# Patient Record
Sex: Female | Born: 1965 | Race: White | Hispanic: No | Marital: Married | Smoking: Never smoker
Health system: Southern US, Community
[De-identification: ages and names within clinical notes are randomized; demographics above are authoritative.]

## PROBLEM LIST (undated history)

## (undated) DIAGNOSIS — G5600 Carpal tunnel syndrome, unspecified upper limb: Secondary | ICD-10-CM

## (undated) DIAGNOSIS — G4733 Obstructive sleep apnea (adult) (pediatric): Secondary | ICD-10-CM

## (undated) DIAGNOSIS — G43909 Migraine, unspecified, not intractable, without status migrainosus: Secondary | ICD-10-CM

## (undated) DIAGNOSIS — M1990 Unspecified osteoarthritis, unspecified site: Secondary | ICD-10-CM

## (undated) DIAGNOSIS — R519 Headache, unspecified: Secondary | ICD-10-CM

## (undated) DIAGNOSIS — I10 Essential (primary) hypertension: Secondary | ICD-10-CM

## (undated) DIAGNOSIS — C801 Malignant (primary) neoplasm, unspecified: Secondary | ICD-10-CM

## (undated) DIAGNOSIS — C4490 Unspecified malignant neoplasm of skin, unspecified: Secondary | ICD-10-CM

## (undated) DIAGNOSIS — G4700 Insomnia, unspecified: Secondary | ICD-10-CM

## (undated) DIAGNOSIS — R112 Nausea with vomiting, unspecified: Secondary | ICD-10-CM

## (undated) DIAGNOSIS — F419 Anxiety disorder, unspecified: Secondary | ICD-10-CM

## (undated) DIAGNOSIS — D649 Anemia, unspecified: Secondary | ICD-10-CM

## (undated) DIAGNOSIS — E785 Hyperlipidemia, unspecified: Secondary | ICD-10-CM

## (undated) DIAGNOSIS — C4491 Basal cell carcinoma of skin, unspecified: Secondary | ICD-10-CM

## (undated) DIAGNOSIS — H5462 Unqualified visual loss, left eye, normal vision right eye: Secondary | ICD-10-CM

## (undated) DIAGNOSIS — Z9889 Other specified postprocedural states: Secondary | ICD-10-CM

## (undated) DIAGNOSIS — R51 Headache: Secondary | ICD-10-CM

## (undated) DIAGNOSIS — G4730 Sleep apnea, unspecified: Secondary | ICD-10-CM

## (undated) HISTORY — DX: Obstructive sleep apnea (adult) (pediatric): G47.33

## (undated) HISTORY — DX: Migraine, unspecified, not intractable, without status migrainosus: G43.909

## (undated) HISTORY — PX: CARDIAC CATHETERIZATION: SHX172

## (undated) HISTORY — PX: GASTRIC BYPASS: SHX52

## (undated) HISTORY — DX: Headache, unspecified: R51.9

## (undated) HISTORY — PX: APPENDECTOMY: SHX54

## (undated) HISTORY — DX: Basal cell carcinoma of skin, unspecified: C44.91

## (undated) HISTORY — DX: Malignant (primary) neoplasm, unspecified: C80.1

## (undated) HISTORY — PX: OTHER SURGICAL HISTORY: SHX169

## (undated) HISTORY — DX: Unspecified osteoarthritis, unspecified site: M19.90

## (undated) HISTORY — DX: Headache: R51

## (undated) HISTORY — PX: SKIN CANCER EXCISION: SHX779

## (undated) HISTORY — DX: Hyperlipidemia, unspecified: E78.5

## (undated) HISTORY — PX: COLONOSCOPY: SHX174

## (undated) HISTORY — DX: Unspecified malignant neoplasm of skin, unspecified: C44.90

## (undated) HISTORY — PX: POLYPECTOMY: SHX149

## (undated) HISTORY — DX: Sleep apnea, unspecified: G47.30

## (undated) HISTORY — DX: Anxiety disorder, unspecified: F41.9

## (undated) HISTORY — PX: BILATERAL CARPAL TUNNEL RELEASE: SHX6508

## (undated) HISTORY — PX: ABLATION: SHX5711

---

## 2006-12-26 ENCOUNTER — Encounter: Admission: RE | Admit: 2006-12-26 | Discharge: 2006-12-26 | Payer: Self-pay | Admitting: Obstetrics and Gynecology

## 2007-02-28 ENCOUNTER — Encounter (INDEPENDENT_AMBULATORY_CARE_PROVIDER_SITE_OTHER): Payer: Self-pay | Admitting: Obstetrics and Gynecology

## 2007-02-28 ENCOUNTER — Inpatient Hospital Stay (HOSPITAL_COMMUNITY): Admission: AD | Admit: 2007-02-28 | Discharge: 2007-03-03 | Payer: Self-pay | Admitting: Obstetrics and Gynecology

## 2009-06-02 ENCOUNTER — Encounter: Payer: Self-pay | Admitting: Neurology

## 2010-09-10 ENCOUNTER — Encounter: Payer: Self-pay | Admitting: Obstetrics and Gynecology

## 2010-11-08 ENCOUNTER — Encounter: Payer: Self-pay | Admitting: Neurology

## 2010-11-23 ENCOUNTER — Encounter: Payer: Self-pay | Admitting: Neurology

## 2011-01-02 NOTE — Op Note (Signed)
Joan West, Joan West              ACCOUNT NO.:  [id number]   MEDICAL RECORD NO.:  [id number]          PATIENT TYPE:  INP   LOCATION:  9107                          FACILITY:  [facility]   PHYSICIAN:  Lenoard Aden, M.D.DATE OF BIRTH:  [birth year]   DATE OF PROCEDURE:  02/28/2007  DATE OF DISCHARGE:                               OPERATIVE REPORT   PREOPERATIVE DIAGNOSES:  1. Term intrauterine pregnancy.  2. Class A2 diabetes mellitus.  3. Chronic hypertension.  4. Depression.  5. Spontaneous rupture of membranes.  6. Desire for elective sterilization.  7. Previous cesarean section, for repeat.   POSTOPERATIVE DIAGNOSES:  1. Term intrauterine pregnancy.  2. Class A2 diabetes mellitus.  3. Chronic hypertension.  4. Depression.  5. Spontaneous rupture of membranes.  6. Desire for elective sterilization.  7. Previous cesarean section, for repeat.   PROCEDURES:  1. Repeat low segment transverse infection.  2. Tubal ligation.   SURGEON:  Lenoard Aden, M.D.   ASSISTANT:  Chester Holstein. Earlene Plater, M.D.   ANESTHESIA:  Spinal by Quillian Quince, M.D.   ESTIMATED BLOOD LOSS:  1000 mL.   COMPLICATIONS:  None.   DRAINS:  Foley.   COUNTS:  Correct.   The patient to recovery in good condition.   BRIEF OPERATIVE NOTE:  After being apprised of the risks of anesthesia,  infection, bleeding, injury to abdominal organs and need for repair,  delayed versus immediate complications to include bowel and bladder  injury, failure risk of tubal ligation of 5-10 per thousand, patient to  the operating room, where she was administered a spinal anesthetic  without complications, prepped and draped in the usual sterile fashion,  Foley catheter placed.  After achieving adequate anesthesia, dilute  Marcaine solution placed, a Pfannenstiel skin incision made in the  previous area with a scalpel, carried down to fascia, which is nicked in  the midline and extended transverse using Mayo scissors.   Rectus muscles  dissected sharply in the midline, peritoneum entered sharply, bladder  blade placed.  Visceral peritoneum is scored sharply off lower uterine  segment, Kerr hysterotomy incision made.  Atraumatic delivery of a full  term-living female, handed to pediatricians in attendance, Apgars 8 and  9.  Cord blood collected.  Placenta delivered manually intact, three-  vessel cord noted.  Uterus exteriorized, curetted using a dry lap pack  and closed in one running layer of 0 Monocryl suture.  Irrigation  accomplished.  Good hemostasis noted.  Bladder flap inspected.  Right  tube traced out to the fimbriated end, ampullary-isthmic portion of tube  isolated, avascular portion of the mesosalpinx cauterized creating a  window, proximal, distal plain ties placed.  Tubal segment excised,  lumens visualized and cauterized using electrocautery.  Same procedure  done on the right tube as on the left tube.  Tubal segments sent to  pathology.  Good hemostasis is noted.  Reinspection of the uterine  incision notes hemostasis.  Bladder flap reinspected, found to be  hemostatic.  Fascia closed using 0 Monocryl in continuous running  fashion.  Subcutaneous tissue reapproximated using a 2-0 plain skin,  closed using staples.  The patient tolerates procedure well, transferred  to recovery in good condition.      Lenoard Aden, M.D.  Electronically Signed     RJT/MEDQ  D:  02/28/2007  T:  03/01/2007  Job:  [id number]

## 2011-01-05 NOTE — Discharge Summary (Signed)
Joan West, Joan West              ACCOUNT NO.:  [id number]   MEDICAL RECORD NO.:  [id number]          PATIENT TYPE:  INP   LOCATION:  9107                          [facility]  [facility]   PHYSICIAN:  Lenoard Aden, M.D.DATE OF BIRTH:  [birth year]   DATE OF ADMISSION:  02/28/2007  DATE OF DISCHARGE:  03/03/2007                               DISCHARGE SUMMARY   HOSPITAL COURSE:  The patient underwent uncomplicated, repeat, low  segment, transverse cesarean section on February 28, 2007.  Postoperative  course was uncomplicated and tolerated regular diet well.  She was  discharged to home on day #3.   DISCHARGE MEDICATIONS:  1. Prenatal vitamins.  2. Iron.  3. Tylox.   FOLLOW UP:  Follow up in the office in 4-6 weeks.      Lenoard Aden, M.D.  Electronically Signed     RJT/MEDQ  D:  04/23/2007  T:  04/24/2007  Job:  [id number]

## 2011-06-05 LAB — CBC
HCT: 34.4 — ABNORMAL LOW
MCV: 91.9
Platelets: 344
RBC: 3.22 — ABNORMAL LOW
WBC: 13 — ABNORMAL HIGH
WBC: 9.9

## 2014-03-15 ENCOUNTER — Encounter (HOSPITAL_BASED_OUTPATIENT_CLINIC_OR_DEPARTMENT_OTHER): Payer: Self-pay | Admitting: Emergency Medicine

## 2014-03-15 ENCOUNTER — Emergency Department (HOSPITAL_BASED_OUTPATIENT_CLINIC_OR_DEPARTMENT_OTHER)
Admission: EM | Admit: 2014-03-15 | Discharge: 2014-03-15 | Disposition: A | Discharge status: Home / Self Care | Payer: BC Managed Care – PPO | Attending: Emergency Medicine | Admitting: Emergency Medicine

## 2014-03-15 DIAGNOSIS — R5381 Other malaise: Secondary | ICD-10-CM | POA: Insufficient documentation

## 2014-03-15 DIAGNOSIS — D509 Iron deficiency anemia, unspecified: Secondary | ICD-10-CM | POA: Insufficient documentation

## 2014-03-15 DIAGNOSIS — I10 Essential (primary) hypertension: Secondary | ICD-10-CM | POA: Insufficient documentation

## 2014-03-15 DIAGNOSIS — R5383 Other fatigue: Secondary | ICD-10-CM

## 2014-03-15 DIAGNOSIS — Z8669 Personal history of other diseases of the nervous system and sense organs: Secondary | ICD-10-CM | POA: Insufficient documentation

## 2014-03-15 DIAGNOSIS — Z79899 Other long term (current) drug therapy: Secondary | ICD-10-CM | POA: Insufficient documentation

## 2014-03-15 HISTORY — DX: Insomnia, unspecified: G47.00

## 2014-03-15 HISTORY — DX: Anemia, unspecified: D64.9

## 2014-03-15 HISTORY — DX: Essential (primary) hypertension: I10

## 2014-03-15 HISTORY — DX: Carpal tunnel syndrome, unspecified upper limb: G56.00

## 2014-03-15 LAB — BASIC METABOLIC PANEL
ANION GAP: 13 (ref 5–15)
BUN: 12 mg/dL (ref 6–23)
CHLORIDE: 100 meq/L (ref 96–112)
CO2: 28 mEq/L (ref 19–32)
CREATININE: 1 mg/dL (ref 0.50–1.10)
Calcium: 9.9 mg/dL (ref 8.4–10.5)
GFR calc Af Amer: 76 mL/min — ABNORMAL LOW (ref >90)
GFR calc non Af Amer: 66 mL/min — ABNORMAL LOW (ref >90)
Glucose, Bld: 109 mg/dL — ABNORMAL HIGH (ref 70–99)
Potassium: 3.6 mEq/L — ABNORMAL LOW (ref 3.7–5.3)
Sodium: 141 mEq/L (ref 137–147)

## 2014-03-15 LAB — CBC WITH DIFFERENTIAL/PLATELET
BASOS ABS: 0 10*3/uL (ref 0.0–0.1)
Basophils Relative: 0 % (ref 0–1)
Eosinophils Absolute: 0.2 10*3/uL (ref 0.0–0.7)
Eosinophils Relative: 2 % (ref 0–5)
HEMATOCRIT: 33.2 % — AB (ref 36.0–46.0)
HEMOGLOBIN: 10.5 g/dL — AB (ref 12.0–15.0)
LYMPHS PCT: 39 % (ref 12–46)
Lymphs Abs: 3 10*3/uL (ref 0.7–4.0)
MCH: 26.4 pg (ref 26.0–34.0)
MCHC: 31.6 g/dL (ref 30.0–36.0)
MCV: 83.6 fL (ref 78.0–100.0)
MONO ABS: 0.6 10*3/uL (ref 0.1–1.0)
MONOS PCT: 7 % (ref 3–12)
Neutro Abs: 4.1 10*3/uL (ref 1.7–7.7)
Neutrophils Relative %: 52 % (ref 43–77)
Platelets: 329 10*3/uL (ref 150–400)
RBC: 3.97 MIL/uL (ref 3.87–5.11)
RDW: 14.9 % (ref 11.5–15.5)
WBC: 7.9 10*3/uL (ref 4.0–10.5)

## 2014-03-15 NOTE — Discharge Instructions (Signed)
Iron Deficiency Anemia °Anemia is when you have a low number of healthy red blood cells. It is often caused by too little iron. This is called iron deficiency anemia. It may make you tired and short of breath. °HOME CARE  °· Take iron as told by your doctor. °· Take vitamins as told by your doctor. °· Eat foods that have iron in them. This includes liver, lean beef, whole-grain bread, eggs, dried fruit, and dark green leafy vegetables. °GET HELP RIGHT AWAY IF: °· You pass out (faint). °· You have chest pain. °· You feel sick to your stomach (nauseous) or throw up (vomit). °· You get very short of breath with activity. °· You are weak. °· You have a fast heartbeat. °· You start to sweat for no reason. °· You become light-headed when getting up from a chair or bed. °MAKE SURE YOU: °· Understand these instructions. °· Will watch your condition. °· Will get help right away if you are not doing well or get worse. °Document Released: 09/08/2010 Document Revised: 08/11/2013 Document Reviewed: 04/13/2013 °ExitCare® Patient Information ©2015 [employer]. This information is not intended to replace advice given to you by your health care provider. Make sure you discuss any questions you have with your health care provider. ° °

## 2014-03-15 NOTE — ED Notes (Signed)
Pt was seen in [place] and has lab work from several weeks ago with a hemoglobin of 6. Pt would like to be checked because she has been increased weakness for the past couple of days. Pt had bypass surgery a couple of years ago and does not digest iron easily.

## 2014-03-15 NOTE — ED Provider Notes (Signed)
CSN: [id number]     Arrival date & time 03/15/14  3151 History   First MD Initiated Contact with Patient 03/15/14 0541     Chief Complaint  Patient presents with  . Weakness     (Consider location/radiation/quality/duration/timing/severity/associated sxs/prior Treatment) HPI This is a 48 year old female with a history of gastric bypass who is on chronic iron therapy. She had lab studies done in [place] by her cardiologist on June 27. These revealed a hemoglobin of 9.8, MCV of 82, iron saturation of 6% and ferritin of 10. All of these are low except for the MCV. She complains of chronic weakness and worry about her iron level. She became more anxious this morning and wanted to have her blood rechecked. She denies specific symptoms including chest pain, shortness of breath, nausea, vomiting or diarrhea. There was not an acute change that occasioned her visit this morning.  Past Medical History  Diagnosis Date  . Carpal tunnel syndrome   . Anemia   . Hypertension   . Insomnia    Past Surgical History  Procedure Laterality Date  . Gastric bypass    . Cesarean section     No family history on file. History  Substance Use Topics  . Smoking status: Never Smoker   . Smokeless tobacco: Not on file  . Alcohol Use: No   OB History   Grav Para Term Preterm Abortions TAB SAB Ect Mult Living                 Review of Systems  All other systems reviewed and are negative.   Allergies  Sulfa antibiotics  Home Medications   Prior to Admission medications   Medication Sig Start Date End Date Taking? Authorizing Provider  DULoxetine (CYMBALTA) 60 MG capsule Take 60 mg by mouth daily.   Yes Historical Provider, MD  ferrous fumarate (HEMOCYTE - 106 MG FE) 325 (106 FE) MG TABS tablet Take 1 tablet by mouth.   Yes Historical Provider, MD  losartan (COZAAR) 50 MG tablet Take 50 mg by mouth daily.   Yes Historical Provider, MD  zolpidem (AMBIEN) 5 MG tablet Take 5 mg by mouth at bedtime as  needed for sleep.   Yes Historical Provider, MD   BP 184/107  Pulse 115  Temp(Src) 98.8 F (37.1 C) (Oral)  Resp 24  Ht 5' 3.25" (1.607 m)  Wt 209 lb (94.802 kg)  BMI 36.71 kg/m2  SpO2 100%  LMP 02/26/2014  Physical Exam General: Well-developed, well-nourished female in no acute distress; appearance consistent with age of record HENT: normocephalic; atraumatic Eyes: pupils equal, round and reactive to light; extraocular muscles intact; no conjunctival pallor Neck: supple Heart: regular rate and rhythm; no murmur Lungs: clear to auscultation bilaterally Abdomen: soft; nondistended; nontender; no masses or hepatosplenomegaly; bowel sounds present Extremities: No deformity; full range of motion; pulses normal; no edema Neurologic: Awake, alert and oriented; motor function intact in all extremities and symmetric; no facial droop Skin: Warm and dry Psychiatric: Mildly anxious    ED Course  Procedures (including critical care time)  MDM   Nursing notes and vitals signs, including pulse oximetry, reviewed.  Summary of this visit's results, reviewed by myself:  Labs:  Results for orders placed during the hospital encounter of 03/15/14 (from the past 24 hour(s))  CBC WITH DIFFERENTIAL     Status: Abnormal   Collection Time    03/15/14  5:40 AM      Result Value Ref Range   WBC 7.9  4.0 - 10.5 K/uL   RBC 3.97  3.87 - 5.11 MIL/uL   Hemoglobin 10.5 (*) 12.0 - 15.0 g/dL   HCT 33.2 (*) 36.0 - 46.0 %   MCV 83.6  78.0 - 100.0 fL   MCH 26.4  26.0 - 34.0 pg   MCHC 31.6  30.0 - 36.0 g/dL   RDW 14.9  11.5 - 15.5 %   Platelets 329  150 - 400 K/uL   Neutrophils Relative % 52  43 - 77 %   Neutro Abs 4.1  1.7 - 7.7 K/uL   Lymphocytes Relative 39  12 - 46 %   Lymphs Abs 3.0  0.7 - 4.0 K/uL   Monocytes Relative 7  3 - 12 %   Monocytes Absolute 0.6  0.1 - 1.0 K/uL   Eosinophils Relative 2  0 - 5 %   Eosinophils Absolute 0.2  0.0 - 0.7 K/uL   Basophils Relative 0  0 - 1 %   Basophils  Absolute 0.0  0.0 - 0.1 K/uL  BASIC METABOLIC PANEL     Status: Abnormal   Collection Time    03/15/14  5:40 AM      Result Value Ref Range   Sodium 141  137 - 147 mEq/L   Potassium 3.6 (*) 3.7 - 5.3 mEq/L   Chloride 100  96 - 112 mEq/L   CO2 28  19 - 32 mEq/L   Glucose, Bld 109 (*) 70 - 99 mg/dL   BUN 12  6 - 23 mg/dL   Creatinine, Ser 1.00  0.50 - 1.10 mg/dL   Calcium 9.9  8.4 - 10.5 mg/dL   GFR calc non Af Amer 66 (*) >90 mL/min   GFR calc Af Amer 76 (*) >90 mL/min   Anion gap 13  5 - 15        Sophia Sperry L Roseland Braun, MD 03/15/14 1694

## 2014-04-19 DIAGNOSIS — F411 Generalized anxiety disorder: Secondary | ICD-10-CM | POA: Insufficient documentation

## 2014-04-20 DIAGNOSIS — J019 Acute sinusitis, unspecified: Secondary | ICD-10-CM | POA: Insufficient documentation

## 2015-02-17 DIAGNOSIS — Z9884 Bariatric surgery status: Secondary | ICD-10-CM | POA: Insufficient documentation

## 2015-02-17 DIAGNOSIS — D509 Iron deficiency anemia, unspecified: Secondary | ICD-10-CM | POA: Insufficient documentation

## 2016-01-11 ENCOUNTER — Encounter: Payer: Self-pay | Admitting: Neurology

## 2016-01-11 ENCOUNTER — Ambulatory Visit (INDEPENDENT_AMBULATORY_CARE_PROVIDER_SITE_OTHER): Payer: BLUE CROSS/BLUE SHIELD | Admitting: Neurology

## 2016-01-11 VITALS — BP 126/90 | HR 68 | Resp 16 | Ht 63.5 in | Wt 223.5 lb

## 2016-01-11 DIAGNOSIS — G2581 Restless legs syndrome: Secondary | ICD-10-CM

## 2016-01-11 DIAGNOSIS — G4700 Insomnia, unspecified: Secondary | ICD-10-CM | POA: Diagnosis not present

## 2016-01-11 DIAGNOSIS — G4733 Obstructive sleep apnea (adult) (pediatric): Secondary | ICD-10-CM | POA: Diagnosis not present

## 2016-01-11 DIAGNOSIS — Z9989 Dependence on other enabling machines and devices: Secondary | ICD-10-CM

## 2016-01-11 DIAGNOSIS — G43009 Migraine without aura, not intractable, without status migrainosus: Secondary | ICD-10-CM

## 2016-01-11 MED ORDER — SUMATRIPTAN SUCCINATE 100 MG PO TABS: 100.0000 mg | ORAL_TABLET | Freq: Once | ORAL | Status: DC | PRN

## 2016-01-11 MED ORDER — GABAPENTIN 800 MG PO TABS: 800.0000 mg | ORAL_TABLET | Freq: Three times a day (TID) | ORAL | Status: DC

## 2016-01-11 NOTE — Progress Notes (Signed)
[facility]  PATIENT: Joan West DOB: July 01, 1966  REFERRING DOCTOR OR PCP:  Vedia Coffer SOURCE: Patient, from [facility], sleep study reports, sleep study data, download data  _________________________________   HISTORICAL  CHIEF COMPLAINT:  Chief Complaint  Patient presents with  . Migraines    Former pt. of Dr. Garth Bigness from [facility].  Here for f/u of OSA and migraines.  Sts. she is compliant with CPAP.  She gets her supplies thru [place] is at least 50 years old.  Sts. she has about 1 migraine per month but that h/a lasts for several days.  Sts. Imitrex po helps/fim  . Sleep Apnea    HISTORY OF PRESENT ILLNESS:  Joan West is a 50 year old woman with migraines, sleep apnea, insomnia and restless leg syndrome who I had previously seen at [facility].  Migraines:   She has had migraines x many years, worse when she was in her 26s. With the migraines she will get a pounding pain and nausea but usually not vomiting. She gets photophobia and phonophobia. She currently is only having migraines about 2-3 times a month and they usually can be knocked out with a sumatriptan., She has not had any triptan lately and some of her headaches have lasted 3 or 4 days.  OSA: [name] was diagnosed with OSA in 2000 and started on CPAP. A PSG in 2005 showed that she had OSA with AHI equals 26 and CPAP pressure was increased from +10 cm to a pressure to +10 cm. Of note, her weight gain 30 pounds between 2000 and 2005. Again and another 40 pounds over the next 5 years. She had gastric bypass surgery in 2010 and lost 40 pounds and continued on CPAP +10.  She was re-titrated in 2010 and was placed on CPAP +11 cm and received a new machine.   I reviewed compliance data from 2013. She had 93% compliance and excellent efficacy with an AHI equals 3.9 on CPAP +11 cm.    Current weight is 223, up slightly compared to last time  I saw her in 2015.     Insomnia:   She has insomnia with both sleep onset and sleep maintenance.   Zolpidem did no tlast long enough so she was placed on eszopiclone 4 mg (2 x 2 mg) nightly.   She also was prescribed Seroquel which helps isomnia some but she has gained 12-15 pounds.     Trazodone had not helped (dose unknown)  RLS:   She had RLS when her iron was low (now gets infusions) but this is better now.      EPWORTH SLEEPINESS SCALE  On a scale of 0 - 3 what is the chance of dozing:  Sitting and Reading:   0 Watching TV:    0 Sitting inactive in a public place: 0 Passenger in car for one hour: 0 Lying down to rest in the afternoon: 1 Sitting and talking to someone: 0 Sitting quietly after lunch:  0 In a car, stopped in traffic:  0  Total (out of 24):    1/24 (normal)  REVIEW OF SYSTEMS: Constitutional: No fevers, chills, sweats, or change in appetite Eyes: No visual changes, double vision, eye pain Ear, nose and throat: No hearing loss, ear pain, nasal congestion, sore throat Cardiovascular: No chest pain, palpitations Respiratory: No shortness of breath at rest or with exertion.   No wheezes.  Has OSA GastrointestinaI: No nausea, vomiting, diarrhea, abdominal pain, fecal incontinence  Genitourinary: No dysuria, urinary retention or frequency.  No nocturia. Musculoskeletal: No neck pain, back pain Integumentary: No rash, pruritus, skin lesions Neurological: as above Psychiatric: No depression at this time.  No anxiety Endocrine: No palpitations, diaphoresis, change in appetite, change in weigh or increased thirst Hematologic/Lymphatic: No anemia now but had in past due to low iron.   No purpura, petechiae. Allergic/Immunologic: No itchy/runny eyes, nasal congestion, recent allergic reactions, rashes  ALLERGIES: Allergies  Allergen Reactions  . Sulfa Antibiotics     HOME MEDICATIONS:  Current outpatient prescriptions:  .  DULoxetine (CYMBALTA) 60 MG capsule,  Take 60 mg by mouth daily., Disp: , Rfl:  .  eszopiclone (LUNESTA) 2 MG TABS tablet, , Disp: , Rfl:  .  losartan (COZAAR) 50 MG tablet, Take 50 mg by mouth daily., Disp: , Rfl:  .  niacin (NIASPAN) 500 MG CR tablet, Take 500 mg by mouth., Disp: , Rfl:  .  QUEtiapine (SEROQUEL) 100 MG tablet, , Disp: , Rfl:   PAST MEDICAL HISTORY: Past Medical History  Diagnosis Date  . Carpal tunnel syndrome   . Anemia   . Hypertension   . Insomnia   . Cancer ([place])   . Skin cancer, basal cell   . Headache   . Obstructive sleep apnea     PAST SURGICAL HISTORY: Past Surgical History  Procedure Laterality Date  . Gastric bypass    . Cesarean section    . Bilateral carpal tunnel release    . Appendectomy      FAMILY HISTORY: Family History  Problem Relation Age of Onset  . Lung disease Mother   . Heart disease Father     SOCIAL HISTORY:  Social History   Social History  . Marital Status: Married    Spouse Name: N/A  . Number of Children: N/A  . Years of Education: N/A   Occupational History  . Not on file.   Social History Main Topics  . Smoking status: Never Smoker   . Smokeless tobacco: Not on file  . Alcohol Use: No  . Drug Use: No  . Sexual Activity: Not on file   Other Topics Concern  . Not on file   Social History Narrative     PHYSICAL EXAM  There were no vitals filed for this visit.  There is no weight on file to calculate BMI.   General: The patient is well-developed and well-nourished and in no acute distress  Eyes:  Funduscopic exam shows normal optic discs and retinal vessels.  HEENT: Head is normocephalic and atraumatic. Pharynx is nonerythematous. There is mild crowding (Malampatti 2) The neck is supple, no carotid bruits are noted.  The neck is nontender.  Cardiovascular: The heart has a regular rate and rhythm with a normal S1 and S2. There were no murmurs, gallops or rubs. Lungs are clear to auscultation.  Skin: Extremities are without  significant edema.  Musculoskeletal:  Back is nontender  Neurologic Exam  Mental status: The patient is alert and oriented x 3 at the time of the examination. The patient has apparent normal recent and remote memory, with an apparently normal attention span and concentration ability.   Speech is normal.  Cranial nerves: Extraocular movements are full. Pupils are equal, round, and reactive to light and accomodation.  Visual fields are full.  Facial symmetry is present. There is good facial sensation to soft touch bilaterally.Facial strength is normal.  Trapezius and sternocleidomastoid strength is normal. No dysarthria is noted.  The tongue  is midline, and the patient has symmetric elevation of the soft palate. No obvious hearing deficits are noted.  Motor:  Muscle bulk is normal.   Tone is normal. Strength is  5 / 5 in all 4 extremities.   Sensory: Sensory testing is intact to pinprick, soft touch and vibration sensation in all 4 extremities.  Coordination: Cerebellar testing reveals good finger-nose-finger and heel-to-shin bilaterally.  Gait and station: Station is normal.   Gait is normal. Tandem gait is normal. Romberg is negative.   Reflexes: Deep tendon reflexes are symmetric and normal bilaterally.    Marland Kitchen    DIAGNOSTIC DATA (LABS, IMAGING, TESTING) - I reviewed patient records, labs, notes, testing and imaging myself where available.  Lab Results  Component Value Date   WBC 7.9 03/15/2014   HGB 10.5* 03/15/2014   HCT 33.2* 03/15/2014   MCV 83.6 03/15/2014   PLT 329 03/15/2014      Component Value Date/Time   NA 141 03/15/2014 0540   K 3.6* 03/15/2014 0540   CL 100 03/15/2014 0540   CO2 28 03/15/2014 0540   GLUCOSE 109* 03/15/2014 0540   BUN 12 03/15/2014 0540   CREATININE 1.00 03/15/2014 0540   CALCIUM 9.9 03/15/2014 0540   GFRNONAA 66* 03/15/2014 0540   GFRAA 76* 03/15/2014 0540       ASSESSMENT AND PLAN  Common migraine without intractability  OSA on  CPAP  Insomnia  Restless leg syndrome   1.    Migraine headaches, a prescription for Imitrex was provided. We discussed that if the frequency increases we would consider a prophylactic agent such as topiramate. 2.   She will continue using CPAP. Her machine is 50 years old and we need to consider getting a new machine. I'll find out if she needs to do a sleep study before ordering that.   She uses [facility].   For insomnia, she will continue Lunesta and I added gabapentin 800 mg by mouth nightly. She will stop the Seroquel as she does not like how she feels when she takes it and she has gained weight. 4.    Restless leg syndrome is mild at this time. Hopefully the gabapentin will this under control. 5.   She will return to see me in 6 months or sooner if there are new or worsening neurologic symptoms.  40 minutes face-to-face evaluation with greater than one half of the time counseling and coordinating care about her various neurologic issues.    Jong Rickman A. Felecia Shelling, MD, PhD [id number], [id number] PM Certified in Neurology, Clinical Neurophysiology, Sleep Medicine, Pain Medicine and Neuroimaging  [facility] [address], [place],  [address] [phone]

## 2016-01-18 ENCOUNTER — Telehealth: Payer: Self-pay | Admitting: Neurology

## 2016-01-18 DIAGNOSIS — G4700 Insomnia, unspecified: Secondary | ICD-10-CM

## 2016-01-18 DIAGNOSIS — G4733 Obstructive sleep apnea (adult) (pediatric): Secondary | ICD-10-CM

## 2016-01-18 DIAGNOSIS — Z9989 Dependence on other enabling machines and devices: Principal | ICD-10-CM

## 2016-01-18 DIAGNOSIS — G2581 Restless legs syndrome: Secondary | ICD-10-CM

## 2016-01-18 NOTE — Telephone Encounter (Signed)
I called Ms. Joan West to let her know that we would need to do a repeat sleep study in order to get a new machine. This sleep study could be either in the lab or at home and I will place an order and summary will be contacting her shortly.

## 2016-02-14 ENCOUNTER — Ambulatory Visit (INDEPENDENT_AMBULATORY_CARE_PROVIDER_SITE_OTHER): Payer: BLUE CROSS/BLUE SHIELD | Admitting: Neurology

## 2016-02-14 DIAGNOSIS — G4733 Obstructive sleep apnea (adult) (pediatric): Secondary | ICD-10-CM

## 2016-02-14 DIAGNOSIS — Z9989 Dependence on other enabling machines and devices: Principal | ICD-10-CM

## 2016-02-14 DIAGNOSIS — G2581 Restless legs syndrome: Secondary | ICD-10-CM

## 2016-02-14 DIAGNOSIS — G4700 Insomnia, unspecified: Secondary | ICD-10-CM

## 2016-02-17 ENCOUNTER — Telehealth: Payer: Self-pay | Admitting: *Deleted

## 2016-02-17 MED ORDER — BUSPIRONE HCL 15 MG PO TABS: 15.0000 mg | ORAL_TABLET | Freq: Two times a day (BID) | ORAL | Status: DC

## 2016-02-17 NOTE — Telephone Encounter (Signed)
-----   Message from Britt Bottom, MD sent at 02/17/2016  8:10 AM EDT ----- She only had minimal sleep apnea, not bad enough to use CPAP for, on the PSG.  She did have severe restless leg/periodic limb movements. Gabapentin (already on) can help but duloxetine can make it worse. Let's stop the duloxetine changing to BuSpar 15 mg by mouth twice a day.

## 2016-02-17 NOTE — Telephone Encounter (Signed)
I have spoken with Joan West this afternoon, and per RAS, advised that sleep study showed only minimal osa--not enough to use CPAP.  She does, however, have severe rls that may interfere with sleep.  The Gabapentin she is on should help, but the Cymbalta she is on will make rls worse, and RAS feels she would benefit from stopping Cymbalta and starting Buspar 15mg  po bid.  Boots verbalized understanding of same and is agreeable to this change.  Buspar rx. escribed to [facility] per her request/fim

## 2016-03-13 ENCOUNTER — Encounter: Payer: Self-pay | Admitting: Neurology

## 2016-03-13 ENCOUNTER — Ambulatory Visit (INDEPENDENT_AMBULATORY_CARE_PROVIDER_SITE_OTHER): Payer: BLUE CROSS/BLUE SHIELD | Admitting: Neurology

## 2016-03-13 VITALS — BP 118/60 | Resp 18 | Ht 63.5 in | Wt 221.0 lb

## 2016-03-13 DIAGNOSIS — Z9989 Dependence on other enabling machines and devices: Principal | ICD-10-CM

## 2016-03-13 DIAGNOSIS — G43009 Migraine without aura, not intractable, without status migrainosus: Secondary | ICD-10-CM | POA: Diagnosis not present

## 2016-03-13 DIAGNOSIS — G4733 Obstructive sleep apnea (adult) (pediatric): Secondary | ICD-10-CM | POA: Diagnosis not present

## 2016-03-13 DIAGNOSIS — G2581 Restless legs syndrome: Secondary | ICD-10-CM

## 2016-03-13 DIAGNOSIS — G4700 Insomnia, unspecified: Secondary | ICD-10-CM

## 2016-03-13 NOTE — Progress Notes (Signed)
[facility]  PATIENT: Joan West DOB: 16-May-1966  REFERRING DOCTOR OR PCP:  Vedia Coffer SOURCE: Patient, from [facility], sleep study reports, sleep study data, download data  _________________________________   HISTORICAL  CHIEF COMPLAINT:  Chief Complaint  Patient presents with  . Migraine    Sts. h/a's are about the same--Imitrex continues to help.  She is here today to discuss recent sleep study.  Sts. despite results, she feels she still has osa---sts. she tried sleeping without CPAP and slept poorly--sts. she had loud snoring and believes she had apneic episodes/fim  . Sleep Apnea  . Insomnia  . RLS    HISTORY OF PRESENT ILLNESS:  Joan West is a 50 year old woman with migraines, sleep apnea, insomnia and restless leg syndrome    OSA: [name] was diagnosed with OSA in 2000 and started on CPAP. A PSG in 2005 showed that she had OSA with AHI equals 26 and CPAP pressure was increased from +10 cm to a pressure to +10 cm. Of note, her weight gain 30 pounds between 2000 and 2005. Again and another 40 pounds over the next 5 years. She had gastric bypass surgery in 2010 and lost 40 pounds and continued on CPAP +10.  She was re-titrated in 2010 and was placed on CPAP +11 cm and received a new machine.   I reviewed compliance data from 2013. She had 93% compliance and excellent efficacy with an AHI equals 3.9 on CPAP +11 cm.    Current weight is 223, up slightly compared to last time I saw her in 2015.     She had a repeat PSG 02/14/16.   She had minimal OSA = 2.7 but had no REM sleep.      Migraines:   She has a long history of migraines x many years, but they were worse when she was in her 54s. She currently is only having migraines about 2-3 times a month and they usually go away with sumatriptan.  When she has a migraine,  she will get a pounding pain and nausea but usually not vomiting. She gets photophobia and phonophobia.   Insomnia:    She has insomnia with both sleep onset and sleep maintenance. Eszopiclone 4 mg (2 x 2 mg) nightly has helped.   She gained 12-15 pounds on Seroquel.     Trazodone had not helped (dose unknown)  RLS:   She had RLS, worse when her iron was low (now gets infusions).     Anxiety:     EPWORTH SLEEPINESS SCALE  On a scale of 0 - 3 what is the chance of dozing:  Sitting and Reading:   0 Watching TV:    0 Sitting inactive in a public place: 0 Passenger in car for one hour: 0 Lying down to rest in the afternoon: 1 Sitting and talking to someone: 0 Sitting quietly after lunch:  0 In a car, stopped in traffic:  0  Total (out of 24):    1/24 (normal)  REVIEW OF SYSTEMS: Constitutional: No fevers, chills, sweats, or change in appetite Eyes: No visual changes, double vision, eye pain Ear, nose and throat: No hearing loss, ear pain, nasal congestion, sore throat Cardiovascular: No chest pain, palpitations Respiratory: No shortness of breath at rest or with exertion.   No wheezes.  Has OSA GastrointestinaI: No nausea, vomiting, diarrhea, abdominal pain, fecal incontinence Genitourinary: No dysuria, urinary retention or frequency.  No nocturia. Musculoskeletal: No neck pain, back pain Integumentary: No rash,  pruritus, skin lesions Neurological: as above Psychiatric: No depression at this time.  No anxiety Endocrine: No palpitations, diaphoresis, change in appetite, change in weigh or increased thirst Hematologic/Lymphatic: No anemia now but had in past due to low iron.   No purpura, petechiae. Allergic/Immunologic: No itchy/runny eyes, nasal congestion, recent allergic reactions, rashes  ALLERGIES: Allergies  Allergen Reactions  . Sulfa Antibiotics     HOME MEDICATIONS:  Current Outpatient Prescriptions:  .  eszopiclone (LUNESTA) 2 MG TABS tablet, , Disp: , Rfl:  .  gabapentin (NEURONTIN) 800 MG tablet, Take 1 tablet (800 mg total) by mouth 3 (three) times daily., Disp: 30 tablet,  Rfl: 11 .  losartan (COZAAR) 50 MG tablet, Take 50 mg by mouth daily., Disp: , Rfl:  .  niacin (NIASPAN) 500 MG CR tablet, Take 500 mg by mouth., Disp: , Rfl:  .  SUMAtriptan (IMITREX) 100 MG tablet, Take 1 tablet (100 mg total) by mouth once as needed for migraine. May repeat in 2 hours if headache persists or recurs., Disp: 10 tablet, Rfl: 11 .  busPIRone (BUSPAR) 15 MG tablet, Take 1 tablet (15 mg total) by mouth 2 (two) times daily. (Patient not taking: Reported on 03/13/2016), Disp: 60 tablet, Rfl: 11  PAST MEDICAL HISTORY: Past Medical History:  Diagnosis Date  . Anemia   . Cancer ([place])   . Carpal tunnel syndrome   . Headache   . Hypertension   . Insomnia   . Obstructive sleep apnea   . Skin cancer, basal cell     PAST SURGICAL HISTORY: Past Surgical History:  Procedure Laterality Date  . APPENDECTOMY    . BILATERAL CARPAL TUNNEL RELEASE    . CESAREAN SECTION    . GASTRIC BYPASS      FAMILY HISTORY: Family History  Problem Relation Age of Onset  . Lung disease Mother   . Heart disease Father     SOCIAL HISTORY:  Social History   Social History  . Marital status: Married    Spouse name: N/A  . Number of children: N/A  . Years of education: N/A   Occupational History  . Not on file.   Social History Main Topics  . Smoking status: Never Smoker  . Smokeless tobacco: Not on file  . Alcohol use No  . Drug use: No  . Sexual activity: Not on file   Other Topics Concern  . Not on file   Social History Narrative  . No narrative on file     PHYSICAL EXAM  Vitals:   03/13/16 0910  BP: 118/60  Resp: 18  Weight: 221 lb (100.2 kg)  Height: 5' 3.5" (1.613 m)    Body mass index is 38.53 kg/m.   General: The patient is well-developed and well-nourished and in no acute distress  HEENT: Head is normocephalic and atraumatic. Pharynx is nonerythematous. There is mild crowding (Malampatti 2) The neck is supple   Neurologic Exam  Mental status: The  patient is alert and oriented x 3 at the time of the examination. The patient has apparent normal recent and remote memory, with an apparently normal attention span and concentration ability.   Speech is normal.  Cranial nerves: Extraocular movements are full. There is good facial sensation to soft touch bilaterally.Facial strength is normal.  Trapezius and sternocleidomastoid strength is normal. No dysarthria is noted.  The tongue is midline, and the patient has symmetric elevation of the soft palate. No obvious hearing deficits are noted.  Motor:  Muscle  bulk is normal.   Tone is normal. Strength is  5 / 5 in all 4 extremities.   Sensory: Sensory testing is intact to  touch in all 4 extremities.  Coordination: Cerebellar testing reveals good finger-nose-finger  Gait and station: Station is normal.   Gait is normal. Tandem gait is normal.    Reflexes: Deep tendon reflexes are symmetric and normal bilaterally.    Marland Kitchen    DIAGNOSTIC DATA (LABS, IMAGING, TESTING) - I reviewed patient records, labs, notes, testing and imaging myself where available.  Lab Results  Component Value Date   WBC 7.9 03/15/2014   HGB 10.5 (L) 03/15/2014   HCT 33.2 (L) 03/15/2014   MCV 83.6 03/15/2014   PLT 329 03/15/2014      Component Value Date/Time   NA 141 03/15/2014 0540   K 3.6 (L) 03/15/2014 0540   CL 100 03/15/2014 0540   CO2 28 03/15/2014 0540   GLUCOSE 109 (H) 03/15/2014 0540   BUN 12 03/15/2014 0540   CREATININE 1.00 03/15/2014 0540   CALCIUM 9.9 03/15/2014 0540   GFRNONAA 66 (L) 03/15/2014 0540   GFRAA 76 (L) 03/15/2014 0540       ASSESSMENT AND PLAN  OSA on CPAP  Common migraine without intractability  Insomnia  Restless leg syndrome   1.   She will continue using CPAP.  Prescription for new supplies.  She uses [facility].   Continue Imitrex for  Migraine headaches  3.   For insomnia, she will continue [place]. 4.   Restless leg syndrome / PLMS may improve by switching  duloxetine to BuSpar. She is on it mostly for anxiety not for depression.   5.   She will return to see me in 6 months or sooner if there are new or worsening neurologic symptoms.   Marialuiza Car A. Felecia Shelling, MD, PhD [id number], [id number] AM Certified in Neurology, Clinical Neurophysiology, Sleep Medicine, Pain Medicine and Neuroimaging  [facility] [address] [place], [place] [address] [phone]

## 2016-03-29 ENCOUNTER — Encounter (HOSPITAL_COMMUNITY)
Admission: RE | Admit: 2016-03-29 | Discharge: 2016-03-29 | Disposition: A | Discharge status: Home / Self Care | Payer: BLUE CROSS/BLUE SHIELD | Source: Ambulatory Visit | Attending: Obstetrics and Gynecology | Admitting: Obstetrics and Gynecology

## 2016-03-29 ENCOUNTER — Other Ambulatory Visit: Payer: Self-pay

## 2016-03-29 ENCOUNTER — Encounter (HOSPITAL_COMMUNITY): Payer: Self-pay

## 2016-03-29 DIAGNOSIS — Z01810 Encounter for preprocedural cardiovascular examination: Secondary | ICD-10-CM | POA: Insufficient documentation

## 2016-03-29 DIAGNOSIS — Z01812 Encounter for preprocedural laboratory examination: Secondary | ICD-10-CM | POA: Insufficient documentation

## 2016-03-29 DIAGNOSIS — I10 Essential (primary) hypertension: Secondary | ICD-10-CM | POA: Diagnosis not present

## 2016-03-29 HISTORY — DX: Other specified postprocedural states: R11.2

## 2016-03-29 HISTORY — DX: Other specified postprocedural states: Z98.890

## 2016-03-29 HISTORY — DX: Unqualified visual loss, left eye, normal vision right eye: H54.62

## 2016-03-29 LAB — BASIC METABOLIC PANEL
ANION GAP: 5 (ref 5–15)
BUN: 16 mg/dL (ref 6–20)
CO2: 25 mmol/L (ref 22–32)
Calcium: 9.2 mg/dL (ref 8.9–10.3)
Chloride: 104 mmol/L (ref 101–111)
Creatinine, Ser: 0.87 mg/dL (ref 0.44–1.00)
Glucose, Bld: 85 mg/dL (ref 65–99)
Potassium: 3.5 mmol/L (ref 3.5–5.1)
SODIUM: 134 mmol/L — AB (ref 135–145)

## 2016-03-29 LAB — CBC
HEMATOCRIT: 35 % — AB (ref 36.0–46.0)
Hemoglobin: 11.9 g/dL — ABNORMAL LOW (ref 12.0–15.0)
MCH: 31 pg (ref 26.0–34.0)
MCHC: 34 g/dL (ref 30.0–36.0)
MCV: 91.1 fL (ref 78.0–100.0)
Platelets: 261 10*3/uL (ref 150–400)
RBC: 3.84 MIL/uL — ABNORMAL LOW (ref 3.87–5.11)
RDW: 13.3 % (ref 11.5–15.5)
WBC: 6.8 10*3/uL (ref 4.0–10.5)

## 2016-03-29 NOTE — Patient Instructions (Addendum)
Your procedure is scheduled on:04/03/16  Enter through the Main Entrance at :2pm Pick up desk phone and dial [phone] and inform us of your arrival.  Please call [phone] if you have any problems the morning of surgery.  Remember: Do not eat food after midnight:Monday Clear liquids are ok until:1130am Tuesday   You may brush your teeth the morning of surgery.  Take these meds the morning of surgery with a sip of water:no meds   DO NOT wear jewelry, eye make-up, lipstick,body lotion, or dark fingernail polish.  (Polished toes are ok) You may wear deodorant.  If you are to be admitted after surgery, leave suitcase in car until your room has been assigned. Patients discharged on the day of surgery will not be allowed to drive home. Wear loose fitting, comfortable clothes for your ride home.

## 2016-04-02 ENCOUNTER — Other Ambulatory Visit: Payer: Self-pay | Admitting: Obstetrics and Gynecology

## 2016-04-03 ENCOUNTER — Ambulatory Visit (HOSPITAL_COMMUNITY): Payer: BLUE CROSS/BLUE SHIELD | Admitting: Anesthesiology

## 2016-04-03 ENCOUNTER — Encounter (HOSPITAL_COMMUNITY)
Admission: RE | Disposition: A | Discharge status: Home / Self Care | Payer: Self-pay | Source: Ambulatory Visit | Attending: Obstetrics and Gynecology

## 2016-04-03 ENCOUNTER — Encounter (HOSPITAL_COMMUNITY): Payer: Self-pay

## 2016-04-03 ENCOUNTER — Ambulatory Visit (HOSPITAL_COMMUNITY)
Admission: RE | Admit: 2016-04-03 | Discharge: 2016-04-03 | Disposition: A | Discharge status: Home / Self Care | Payer: BLUE CROSS/BLUE SHIELD | Source: Ambulatory Visit | Attending: Obstetrics and Gynecology | Admitting: Obstetrics and Gynecology

## 2016-04-03 DIAGNOSIS — Z9884 Bariatric surgery status: Secondary | ICD-10-CM | POA: Diagnosis not present

## 2016-04-03 DIAGNOSIS — Z6838 Body mass index (BMI) 38.0-38.9, adult: Secondary | ICD-10-CM | POA: Diagnosis not present

## 2016-04-03 DIAGNOSIS — D500 Iron deficiency anemia secondary to blood loss (chronic): Secondary | ICD-10-CM | POA: Insufficient documentation

## 2016-04-03 DIAGNOSIS — Z85828 Personal history of other malignant neoplasm of skin: Secondary | ICD-10-CM | POA: Diagnosis not present

## 2016-04-03 DIAGNOSIS — I10 Essential (primary) hypertension: Secondary | ICD-10-CM | POA: Insufficient documentation

## 2016-04-03 DIAGNOSIS — N920 Excessive and frequent menstruation with regular cycle: Secondary | ICD-10-CM | POA: Diagnosis not present

## 2016-04-03 DIAGNOSIS — N840 Polyp of corpus uteri: Secondary | ICD-10-CM | POA: Diagnosis not present

## 2016-04-03 DIAGNOSIS — G4733 Obstructive sleep apnea (adult) (pediatric): Secondary | ICD-10-CM | POA: Insufficient documentation

## 2016-04-03 HISTORY — PX: DILITATION & CURRETTAGE/HYSTROSCOPY WITH NOVASURE ABLATION: SHX5568

## 2016-04-03 SURGERY — DILATATION & CURETTAGE/HYSTEROSCOPY WITH NOVASURE ABLATION
Anesthesia: General | Site: Vagina

## 2016-04-03 MED ORDER — PROPOFOL 10 MG/ML IV BOLUS
INTRAVENOUS | Status: AC
  Filled 2016-04-03: qty 20

## 2016-04-03 MED ORDER — TRAMADOL HCL 50 MG PO TABS: 50.0000 mg | ORAL_TABLET | Freq: Four times a day (QID) | ORAL | 0 refills | Status: DC | PRN

## 2016-04-03 MED ORDER — MIDAZOLAM HCL 2 MG/2ML IJ SOLN
INTRAMUSCULAR | Status: DC | PRN
  Administered 2016-04-03: 2 mg via INTRAVENOUS

## 2016-04-03 MED ORDER — DEXAMETHASONE SODIUM PHOSPHATE 4 MG/ML IJ SOLN: 8.0000 mg | Freq: Once | INTRAMUSCULAR | Status: DC | PRN

## 2016-04-03 MED ORDER — SODIUM CHLORIDE 0.9 % IR SOLN
Status: DC | PRN
  Administered 2016-04-03: 3000 mL

## 2016-04-03 MED ORDER — KETOROLAC TROMETHAMINE 30 MG/ML IJ SOLN: 30.0000 mg | Freq: Once | INTRAMUSCULAR | Status: DC

## 2016-04-03 MED ORDER — GLYCOPYRROLATE 0.2 MG/ML IJ SOLN
INTRAMUSCULAR | Status: DC | PRN
  Administered 2016-04-03 (×2): 0.1 mg via INTRAVENOUS

## 2016-04-03 MED ORDER — SCOPOLAMINE 1 MG/3DAYS TD PT72
MEDICATED_PATCH | TRANSDERMAL | Status: AC
  Filled 2016-04-03: qty 1

## 2016-04-03 MED ORDER — IBUPROFEN 100 MG/5ML PO SUSP: 200.0000 mg | Freq: Four times a day (QID) | ORAL | Status: DC | PRN

## 2016-04-03 MED ORDER — GLYCOPYRROLATE 0.2 MG/ML IJ SOLN
INTRAMUSCULAR | Status: AC
  Filled 2016-04-03: qty 1

## 2016-04-03 MED ORDER — ONDANSETRON HCL 4 MG/2ML IJ SOLN
INTRAMUSCULAR | Status: DC | PRN
  Administered 2016-04-03: 4 mg via INTRAVENOUS

## 2016-04-03 MED ORDER — DEXAMETHASONE SODIUM PHOSPHATE 10 MG/ML IJ SOLN
INTRAMUSCULAR | Status: DC | PRN
  Administered 2016-04-03: 4 mg via INTRAVENOUS

## 2016-04-03 MED ORDER — LACTATED RINGERS IV SOLN
INTRAVENOUS | Status: DC
  Administered 2016-04-03: 125 mL/h via INTRAVENOUS

## 2016-04-03 MED ORDER — METOCLOPRAMIDE HCL 5 MG/ML IJ SOLN
INTRAMUSCULAR | Status: AC
  Filled 2016-04-03: qty 2

## 2016-04-03 MED ORDER — KETOROLAC TROMETHAMINE 30 MG/ML IJ SOLN
INTRAMUSCULAR | Status: AC
  Filled 2016-04-03: qty 1

## 2016-04-03 MED ORDER — FENTANYL CITRATE (PF) 100 MCG/2ML IJ SOLN
INTRAMUSCULAR | Status: AC
  Filled 2016-04-03: qty 2

## 2016-04-03 MED ORDER — HYDROCODONE-ACETAMINOPHEN 7.5-325 MG PO TABS: 1.0000 | ORAL_TABLET | Freq: Once | ORAL | Status: DC | PRN

## 2016-04-03 MED ORDER — METOCLOPRAMIDE HCL 5 MG/ML IJ SOLN
INTRAMUSCULAR | Status: DC | PRN
  Administered 2016-04-03: 10 mg via INTRAVENOUS

## 2016-04-03 MED ORDER — HYDROMORPHONE HCL 1 MG/ML IJ SOLN
INTRAMUSCULAR | Status: DC | PRN
  Administered 2016-04-03: 1 mg via INTRAVENOUS

## 2016-04-03 MED ORDER — MEPERIDINE HCL 25 MG/ML IJ SOLN: 6.2500 mg | INTRAMUSCULAR | Status: DC | PRN

## 2016-04-03 MED ORDER — BUPIVACAINE HCL (PF) 0.25 % IJ SOLN
INTRAMUSCULAR | Status: DC | PRN
  Administered 2016-04-03: 20 mL

## 2016-04-03 MED ORDER — CEFAZOLIN SODIUM-DEXTROSE 2-4 GM/100ML-% IV SOLN
2.0000 g | INTRAVENOUS | Status: AC
  Administered 2016-04-03: 2 g via INTRAVENOUS

## 2016-04-03 MED ORDER — SCOPOLAMINE 1 MG/3DAYS TD PT72
1.0000 | MEDICATED_PATCH | Freq: Once | TRANSDERMAL | Status: DC
  Administered 2016-04-03: 1.5 mg via TRANSDERMAL

## 2016-04-03 MED ORDER — IBUPROFEN 200 MG PO TABS: 200.0000 mg | ORAL_TABLET | Freq: Four times a day (QID) | ORAL | Status: DC | PRN

## 2016-04-03 MED ORDER — FENTANYL CITRATE (PF) 100 MCG/2ML IJ SOLN: 25.0000 ug | INTRAMUSCULAR | Status: DC | PRN

## 2016-04-03 MED ORDER — ONDANSETRON HCL 4 MG/2ML IJ SOLN
INTRAMUSCULAR | Status: AC
  Filled 2016-04-03: qty 2

## 2016-04-03 MED ORDER — BUPIVACAINE HCL (PF) 0.25 % IJ SOLN
INTRAMUSCULAR | Status: AC
  Filled 2016-04-03: qty 30

## 2016-04-03 MED ORDER — HYDROMORPHONE HCL 1 MG/ML IJ SOLN
INTRAMUSCULAR | Status: AC
  Filled 2016-04-03: qty 1

## 2016-04-03 MED ORDER — MIDAZOLAM HCL 2 MG/2ML IJ SOLN
INTRAMUSCULAR | Status: AC
  Filled 2016-04-03: qty 2

## 2016-04-03 MED ORDER — DEXAMETHASONE SODIUM PHOSPHATE 4 MG/ML IJ SOLN
INTRAMUSCULAR | Status: AC
  Filled 2016-04-03: qty 1

## 2016-04-03 MED ORDER — FENTANYL CITRATE (PF) 100 MCG/2ML IJ SOLN
INTRAMUSCULAR | Status: DC | PRN
  Administered 2016-04-03 (×2): 50 ug via INTRAVENOUS

## 2016-04-03 MED ORDER — LIDOCAINE HCL (CARDIAC) 20 MG/ML IV SOLN
INTRAVENOUS | Status: AC
  Filled 2016-04-03: qty 5

## 2016-04-03 MED ORDER — KETOROLAC TROMETHAMINE 30 MG/ML IJ SOLN
INTRAMUSCULAR | Status: DC | PRN
  Administered 2016-04-03: 30 mg via INTRAVENOUS

## 2016-04-03 MED ORDER — LIDOCAINE HCL (CARDIAC) 20 MG/ML IV SOLN
INTRAVENOUS | Status: DC | PRN
  Administered 2016-04-03: 40 mg via INTRAVENOUS

## 2016-04-03 MED ORDER — PROPOFOL 10 MG/ML IV BOLUS
INTRAVENOUS | Status: DC | PRN
  Administered 2016-04-03: 200 mg via INTRAVENOUS

## 2016-04-03 SURGICAL SUPPLY — 16 items
ABLATOR ENDOMETRIAL BIPOLAR (ABLATOR) ×3 IMPLANT
CATH ROBINSON RED A/P 16FR (CATHETERS) ×3 IMPLANT
CLOTH BEACON ORANGE TIMEOUT ST (SAFETY) ×3 IMPLANT
CONTAINER PREFILL 10% NBF 60ML (FORM) ×4 IMPLANT
GLOVE BIO SURGEON STRL SZ7.5 (GLOVE) ×3 IMPLANT
GLOVE BIOGEL PI IND STRL 7.0 (GLOVE) ×1 IMPLANT
GLOVE BIOGEL PI INDICATOR 7.0 (GLOVE) ×2
GOWN STRL REUS W/TWL LRG LVL3 (GOWN DISPOSABLE) ×6 IMPLANT
PACK VAGINAL MINOR WOMEN LF (CUSTOM PROCEDURE TRAY) ×3 IMPLANT
PAD OB MATERNITY 4.3X12.25 (PERSONAL CARE ITEMS) ×3 IMPLANT
PAD PREP 24X48 CUFFED NSTRL (MISCELLANEOUS) ×3 IMPLANT
SYR TB 1ML 25GX5/8 (SYRINGE) ×1 IMPLANT
TOWEL OR 17X24 6PK STRL BLUE (TOWEL DISPOSABLE) ×6 IMPLANT
TUBING AQUILEX INFLOW (TUBING) ×3 IMPLANT
TUBING AQUILEX OUTFLOW (TUBING) ×3 IMPLANT
WATER STERILE IRR 1000ML POUR (IV SOLUTION) ×3 IMPLANT

## 2016-04-03 NOTE — Progress Notes (Signed)
Patient ID: Joan West, female   DOB: March 20, 1966, 50 y.o.   MRN: [id number] Patient seen and examined. Consent witnessed and signed. No changes noted. Update completed.

## 2016-04-03 NOTE — Anesthesia Postprocedure Evaluation (Signed)
Anesthesia Post Note  Patient: Joan West  Procedure(s) Performed: Procedure(s) (LRB): DILATATION & CURETTAGE/HYSTEROSCOPY WITH NOVASURE ABLATION (N/A)  Patient location during evaluation: PACU Anesthesia Type: General Level of consciousness: awake Pain management: pain level controlled Vital Signs Assessment: post-procedure vital signs reviewed and stable Respiratory status: spontaneous breathing Cardiovascular status: stable Postop Assessment: no signs of nausea or vomiting Anesthetic complications: no     Last Vitals:  Vitals:   04/03/16 1700 04/03/16 1715  BP: 116/89 130/79  Pulse: 69 74  Resp: 10 18  Temp:  36.8 C    Last Pain:  Vitals:   04/03/16 1715  TempSrc:   PainSc: 0-No pain   Pain Goal: Patients Stated Pain Goal: 5 (04/03/16 1715)               [place]

## 2016-04-03 NOTE — Anesthesia Preprocedure Evaluation (Signed)
Anesthesia Evaluation  Patient identified by MRN, date of birth, ID band Patient awake    Reviewed: Allergy & Precautions, H&P , NPO status , Patient's Chart, lab work & pertinent test results  History of Anesthesia Complications (+) PONV and history of anesthetic complications  Airway Mallampati: II  TM Distance: >3 FB Neck ROM: full    Dental no notable dental hx.    Pulmonary    Pulmonary exam normal        Cardiovascular Exercise Tolerance: Good hypertension, On Medications negative cardio ROS Normal cardiovascular exam     Neuro/Psych    GI/Hepatic negative GI ROS, Neg liver ROS,   Endo/Other  Morbid obesity  Renal/GU negative Renal ROS  negative genitourinary   Musculoskeletal   Abdominal (+) + obese,   Peds  Hematology   Anesthesia Other Findings   Reproductive/Obstetrics negative OB ROS                             Anesthesia Physical Anesthesia Plan  ASA: III  Anesthesia Plan: General   Post-op Pain Management:    Induction: Intravenous  Airway Management Planned: LMA  Additional Equipment:   Intra-op Plan:   Post-operative Plan: Extubation in OR  Informed Consent: I have reviewed the patients History and Physical, chart, labs and discussed the procedure including the risks, benefits and alternatives for the proposed anesthesia with the patient or authorized representative who has indicated his/her understanding and acceptance.     Plan Discussed with: CRNA and Surgeon  Anesthesia Plan Comments:         Anesthesia Quick Evaluation

## 2016-04-03 NOTE — Discharge Instructions (Addendum)
°  Post Anesthesia Home Care Instructions  Activity: Get plenty of rest for the remainder of the day. A responsible adult should stay with you for 24 hours following the procedure.  For the next 24 hours, DO NOT: -Drive a car -Paediatric nurse -Drink alcoholic beverages -Take any medication unless instructed by your physician -Make any legal decisions or sign important papers.  Meals: Start with liquid foods such as gelatin or soup. Progress to regular foods as tolerated. Avoid greasy, spicy, heavy foods. If nausea and/or vomiting occur, drink only clear liquids until the nausea and/or vomiting subsides. Call your physician if vomiting continues.  Special Instructions/Symptoms: Your throat may feel dry or sore from the anesthesia or the breathing tube placed in your throat during surgery. If this causes discomfort, gargle with warm salt water. The discomfort should disappear within 24 hours.  If you had a scopolamine patch placed behind your ear for the management of post- operative nausea and/or vomiting:  1. The medication in the patch is effective for 72 hours, after which it should be removed.  Wrap patch in a tissue and discard in the trash. Wash hands thoroughly with soap and water. 2. You may remove the patch earlier than 72 hours if you experience unpleasant side effects which may include dry mouth, dizziness or visual disturbances. 3. Avoid touching the patch. Wash your hands with soap and water after contact with the patch.   DISCHARGE INSTRUCTIONS: HYSTEROSCOPY / ENDOMETRIAL ABLATION The following instructions have been prepared to help you care for yourself upon your return home.  May Remove Scop patch on or before: Friday  8/18  May take Ibuprofen after: 10 pm  May take stool softner while taking narcotic pain medication to prevent constipation.  Drink plenty of water.  Personal hygiene:  Use sanitary pads for vaginal drainage, not tampons.  Shower the day after your  procedure.  NO tub baths, pools or Jacuzzis for 2-3 weeks.  Wipe front to back after using the bathroom.  Activity and limitations:  Do NOT drive or operate any equipment for 24 hours. The effects of anesthesia are still present and drowsiness may result.  Do NOT rest in bed all day.  Walking is encouraged.  Walk up and down stairs slowly.  You may resume your normal activity in one to two days or as indicated by your physician. Sexual activity: NO intercourse for at least 2 weeks after the procedure, or as indicated by your Doctor.  Diet: Eat a light meal as desired this evening. You may resume your usual diet tomorrow.  Return to Work: You may resume your work activities in one to two days or as indicated by Marine scientist.  What to expect after your surgery: Expect to have vaginal bleeding/discharge for 2-3 days and spotting for up to 10 days. It is not unusual to have soreness for up to 1-2 weeks. You may have a slight burning sensation when you urinate for the first day. Mild cramps may continue for a couple of days. You may have a regular period in 2-6 weeks.  Call your doctor for any of the following:  Excessive vaginal bleeding or clotting, saturating and changing one pad every hour.  Inability to urinate 6 hours after discharge from hospital.  Pain not relieved by pain medication.  Fever of 100.4 F or greater.  Unusual vaginal discharge or odor.  Return to office _________________Call for an appointment ___________________ Patients signature: ______________________ Nurses signature ________________________  [facility] [phone]

## 2016-04-03 NOTE — Op Note (Signed)
04/03/2016  4:13 PM  PATIENT:  Joan West  50 y.o. female  PRE-OPERATIVE DIAGNOSIS:  Menorrhagia  POST-OPERATIVE DIAGNOSIS:  Menorrhagia Endometrial polyp  PROCEDURE:  Procedure(s): DILATATION & CURETTAGE OPERATIVE HYSTEROSCOPY WITH NOVASURE ABLATION ENDOMETRIAL POLYPECTOMY  SURGEON:  Surgeon(s): Brien Few, MD  ASSISTANTS: none   ANESTHESIA:   local and general  ESTIMATED BLOOD LOSS: MINIMAL  FLUID DEFICIT: 75CC  DRAINS: none   LOCAL MEDICATIONS USED:  MARCAINE     SPECIMEN:  Source of Specimen:  EMC AND POLYP  DISPOSITION OF SPECIMEN:  PATHOLOGY  COUNTS:  YES  DICTATION #: DICTATED  PLAN OF CARE: DC HOME  PATIENT DISPOSITION:  PACU - hemodynamically stable.

## 2016-04-03 NOTE — H&P (Signed)
Joan West is an 50 y.o. female. Menorrhagia with secondary anemia.  Pertinent Gynecological History: Menses: flow is moderate Bleeding: na Contraception: none DES exposure: denies Blood transfusions: none Sexually transmitted diseases: no past history Previous GYN Procedures: DNC  Last mammogram: normal Date: 2017 Last pap: normal Date: 2017 OB History: G1, P1   Menstrual History: Menarche age: 53 Patient's last menstrual period was 03/15/2016 (approximate).    Past Medical History:  Diagnosis Date  . Anemia    has received iron infusions  . Cancer ([place])   . Carpal tunnel syndrome   . Headache   . Hypertension   . Insomnia   . Obstructive sleep apnea   . PONV (postoperative nausea and vomiting)   . Skin cancer, basal cell   . Vision loss of left eye    history of lazy eye    Past Surgical History:  Procedure Laterality Date  . APPENDECTOMY    . BILATERAL CARPAL TUNNEL RELEASE    . CESAREAN SECTION    . GASTRIC BYPASS      Family History  Problem Relation Age of Onset  . Lung disease Mother   . Heart disease Father     Social History:  reports that she has never smoked. She has never used smokeless tobacco. She reports that she drinks about 0.6 oz of alcohol per week . She reports that she does not use drugs.  Allergies:  Allergies  Allergen Reactions  . Sulfa Antibiotics Rash    fever    No prescriptions prior to admission.    Review of Systems  Constitutional: Negative.   All other systems reviewed and are negative.   Height 5' 3.5" (1.613 m), weight 99.8 kg (220 lb), last menstrual period 03/15/2016. Physical Exam  Nursing note and vitals reviewed. Constitutional: She is oriented to person, place, and time. She appears well-developed and well-nourished.  Neck: Normal range of motion. Neck supple.  Cardiovascular: Normal rate and regular rhythm.   Respiratory: Effort normal and breath sounds normal.  GI: Soft. Bowel sounds are normal.   Genitourinary: Vagina normal and uterus normal.  Musculoskeletal: Normal range of motion.  Neurological: She is alert and oriented to person, place, and time. She has normal reflexes.  Skin: Skin is warm and dry.  Psychiatric: She has a normal mood and affect.    No results found for this or any previous visit (from the past 24 hour(s)).  No results found.  Assessment/Plan: Refractory menorrhagia with secondary anemia Diag HS, D&C, Novasure. Consent done.   Katrisha Segall J 04/03/2016, 11:34 AM

## 2016-04-03 NOTE — Op Note (Signed)
Joan West, Joan West              ACCOUNT NO.:  [id number]  MEDICAL RECORD NO.:  [id number]  LOCATION:  [facility]                          FACILITY:  [facility]  PHYSICIAN:  Lovenia Kim, M.D.DATE OF BIRTH:  1966-05-01  DATE OF PROCEDURE: DATE OF DISCHARGE:  04/03/2016                              OPERATIVE REPORT   PREOPERATIVE DIAGNOSIS:  Refractory menorrhagia with a history of secondary anemia and need for iron transfusions.  POSTOPERATIVE DIAGNOSES: 1. Refractory menorrhagia with a history of secondary anemia and need     for iron transfusions. 2. Endometrial polyp.  PROCEDURES:  Operative hysteroscopy, D and C, endometrial polypectomy, NovaSure endometrial ablation.  SURGEON:  Lovenia Kim, M.D.  ASSISTANT:  None.  ANESTHESIA:  General and local.  ESTIMATED BLOOD LOSS:  Less than 50 mL.  FLUID DEFICIT:  75 mL.  COMPLICATIONS:  None.  DRAINS:  None.  COUNTS:  Correct.  DISPOSITION:  The patient to recovery in good condition.  BRIEF OPERATIVE NOTE:  After being apprised of risks of anesthesia, infection, bleeding, injury to surrounding organs, possible need for repair, delayed versus immediate complications to include bowel and bladder injury, possible need for repair, the patient was brought to the operating room where she was administered general anesthetic without complications.  Prepped and draped in usual sterile fashion. Catheterized until the bladder was empty.  Exam under anesthesia revealed a bulky mid position to retroflexed uterus and no adnexal masses.  At this time, cervix easily dilated up to 21 Pratt dilator. Hysteroscope placed.  Visualization revealed thickened endometrium along the back right lateral wall consistent with endometrial polyp which was extracted using polyp forceps without difficulty and sent to Pathology. Endometrial curettings were collected in a 4-quadrant method using sharp curettage.  Repeat visualization reveals a  normal-appearing endometrial cavity.  The NovaSure device was then placed and seated to a length of 6.5, a width of 4.5, then initiated for 73 seconds after a negative CO2 test. Afterwards, the device was removed, inspected, and found to be intact. Revisualization of endometrial cavity reveals a normal-appearing, well- ablated endometrial cavity.  No evidence of uterine perforation.  The patient tolerated the procedure well, was awakened, and transferred to recovery in good condition.     Lovenia Kim, M.D.     RJT/MEDQ  D:  04/03/2016  T:  04/03/2016  Job:  [id number]  cc:   Lovenia Kim, M.D. Fax: [phone]

## 2016-04-03 NOTE — Transfer of Care (Signed)
Immediate Anesthesia Transfer of Care Note  Patient: Joan West  Procedure(s) Performed: Procedure(s): DILATATION & CURETTAGE/HYSTEROSCOPY WITH NOVASURE ABLATION (N/A)  Patient Location: PACU  Anesthesia Type:General  Level of Consciousness: awake, alert , oriented and patient cooperative  Airway & Oxygen Therapy: Patient Spontanous Breathing and Patient connected to nasal cannula oxygen  Post-op Assessment: Report given to RN and Post -op Vital signs reviewed and stable  Post vital signs: Reviewed and stable  Last Vitals:  Vitals:   04/03/16 1438  BP: 128/87  Pulse: 61  Resp: 16  Temp: 36.7 C    Last Pain:  Vitals:   04/03/16 1438  TempSrc: Oral      Patients Stated Pain Goal: 5 ([id number])  Complications: No apparent anesthesia complications

## 2016-04-03 NOTE — Anesthesia Procedure Notes (Signed)
Procedure Name: LMA Insertion Date/Time: 04/03/2016 3:49 PM Performed by: Raenette Rover Pre-anesthesia Checklist: Patient identified, Emergency Drugs available, Suction available and Patient being monitored Patient Re-evaluated:Patient Re-evaluated prior to inductionOxygen Delivery Method: Circle system utilized Preoxygenation: Pre-oxygenation with 100% oxygen Intubation Type: IV induction LMA: LMA inserted LMA Size: 4.0 Number of attempts: 1 Placement Confirmation: positive ETCO2,  CO2 detector and breath sounds checked- equal and bilateral Tube secured with: Tape Dental Injury: Teeth and Oropharynx as per pre-operative assessment

## 2016-04-04 ENCOUNTER — Encounter (HOSPITAL_COMMUNITY): Payer: Self-pay | Admitting: Obstetrics and Gynecology

## 2016-07-18 ENCOUNTER — Ambulatory Visit: Payer: BLUE CROSS/BLUE SHIELD | Admitting: Neurology

## 2017-01-28 ENCOUNTER — Encounter: Payer: Self-pay | Admitting: Neurology

## 2017-02-10 ENCOUNTER — Other Ambulatory Visit: Payer: Self-pay | Admitting: Neurology

## 2017-03-13 ENCOUNTER — Ambulatory Visit: Payer: BLUE CROSS/BLUE SHIELD | Admitting: Neurology

## 2018-04-02 ENCOUNTER — Telehealth: Payer: Self-pay | Admitting: Neurology

## 2018-04-02 ENCOUNTER — Ambulatory Visit: Payer: BLUE CROSS/BLUE SHIELD | Admitting: Neurology

## 2018-04-02 ENCOUNTER — Encounter: Payer: Self-pay | Admitting: Neurology

## 2018-04-02 ENCOUNTER — Other Ambulatory Visit: Payer: Self-pay

## 2018-04-02 DIAGNOSIS — G4733 Obstructive sleep apnea (adult) (pediatric): Secondary | ICD-10-CM

## 2018-04-02 DIAGNOSIS — G2581 Restless legs syndrome: Secondary | ICD-10-CM

## 2018-04-02 DIAGNOSIS — G43009 Migraine without aura, not intractable, without status migrainosus: Secondary | ICD-10-CM | POA: Diagnosis not present

## 2018-04-02 DIAGNOSIS — F411 Generalized anxiety disorder: Secondary | ICD-10-CM

## 2018-04-02 DIAGNOSIS — Z9989 Dependence on other enabling machines and devices: Secondary | ICD-10-CM

## 2018-04-02 DIAGNOSIS — G4700 Insomnia, unspecified: Secondary | ICD-10-CM

## 2018-04-02 MED ORDER — SUMATRIPTAN SUCCINATE 100 MG PO TABS: 100.0000 mg | ORAL_TABLET | Freq: Once | ORAL | 11 refills | Status: DC | PRN

## 2018-04-02 NOTE — Telephone Encounter (Signed)
Patient states she will call back to schedule 18 month f/u per Dr. Felecia Shelling.

## 2018-04-02 NOTE — Progress Notes (Signed)
[facility]  PATIENT: Joan West DOB: 15-Apr-1966  REFERRING DOCTOR OR PCP:  Vedia Coffer SOURCE: Patient, from [facility], sleep study reports, sleep study data, download data  _________________________________   HISTORICAL  CHIEF COMPLAINT:  Chief Complaint  Patient presents with  . Sleep Apnea    Reports compliance with CPAP. Imitrex still effective for migraines. Has seen behavioral health for anxiety--Cymbalta and Buspar both stopped and pt. now taking Seroquel, Zoloft and Gabapentin, which also help insomnia and RLS/fim  . Migraines  . Insomnia  . RLS    HISTORY OF PRESENT ILLNESS:  Joan West is a 52 y.o. woman with migraines, sleep apnea, insomnia and restless leg syndrome    Update 04/02/2018: She feels her obstructive sleep apnea is doing well and she is compliant with the CPAP using it nightly.  She has some excessive daytime sleepiness if she sleeps poorly.  She has a long history of insomnia, often because she has difficulty quieting her mind at night.  She also has some restless leg syndrome.  She generally has done well at night on the combination of Seroquel, Zoloft, Lunesta and gabapentin 300 mg.   These medications that also helped her anxiety.  She sees behavioral health for anxiety.  Anxiety is a little worse with her brother needing major cardiac surgery.    She has a long history of migraine headaches.  These are generally doing well and are occurring a couple  times a month on average..  Migraines are associated with pounding pain and nausea.  She does not get vomiting.  She will usually have photophobia and phonophobia.  Takes a worse when she moves.  When she gets a migraine she will take Imitrex with benefit.  From 03/13/2016: OSA: Menucha was diagnosed with OSA in 2000 and started on CPAP. A PSG in 2005 showed that she had OSA with AHI equals 26 and CPAP pressure was increased from +10 cm to a pressure to +10 cm.  Of note, her weight gain 30 pounds between 2000 and 2005. Again and another 40 pounds over the next 5 years. She had gastric bypass surgery in 2010 and lost 40 pounds and continued on CPAP +10.  She was re-titrated in 2010 and was placed on CPAP +11 cm and received a new machine.   I reviewed compliance data from 2013. She had 93% compliance and excellent efficacy with an AHI equals 3.9 on CPAP +11 cm.    Current weight is 223, up slightly compared to last time I saw her in 2015.     She had a repeat PSG 02/14/16.   She had minimal OSA = 2.7 but had no REM sleep.      Migraines:   She has a long history of migraines x many years, but they were worse when she was in her 58s. She currently is only having migraines about 2-3 times a month and they usually go away with sumatriptan.  When she has a migraine,  she will get a pounding pain and nausea but usually not vomiting. She gets photophobia and phonophobia.   Insomnia:   She has insomnia with both sleep onset and sleep maintenance. Eszopiclone 4 mg (2 x 2 mg) nightly has helped.   She gained 12-15 pounds on Seroquel.     Trazodone had not helped (dose unknown)  RLS:   She had RLS, worse when her iron was low (now gets infusions).     Anxiety:  EPWORTH SLEEPINESS SCALE  On a scale of 0 - 3 what is the chance of dozing:  Sitting and Reading:   0 Watching TV:    0 Sitting inactive in a public place: 0 Passenger in car for one hour: 0 Lying down to rest in the afternoon: 1 Sitting and talking to someone: 0 Sitting quietly after lunch:  0 In a car, stopped in traffic:  0  Total (out of 24):    1/24 (normal)  REVIEW OF SYSTEMS: Constitutional: No fevers, chills, sweats, or change in appetite Eyes: No visual changes, double vision, eye pain Ear, nose and throat: No hearing loss, ear pain, nasal congestion, sore throat Cardiovascular: No chest pain, palpitations Respiratory: No shortness of breath at rest or with exertion.   No wheezes.  Has  OSA GastrointestinaI: No nausea, vomiting, diarrhea, abdominal pain, fecal incontinence Genitourinary: No dysuria, urinary retention or frequency.  No nocturia. Musculoskeletal: No neck pain, back pain Integumentary: No rash, pruritus, skin lesions Neurological: as above Psychiatric: No depression at this time.  No anxiety Endocrine: No palpitations, diaphoresis, change in appetite, change in weigh or increased thirst Hematologic/Lymphatic: No anemia now but had in past due to low iron.   No purpura, petechiae. Allergic/Immunologic: No itchy/runny eyes, nasal congestion, recent allergic reactions, rashes  ALLERGIES: Allergies  Allergen Reactions  . Sulfa Antibiotics Rash    fever    HOME MEDICATIONS:  Current Outpatient Medications:  .  Cholecalciferol (VITAMIN D PO), Take 1 tablet by mouth daily., Disp: , Rfl:  .  Cyanocobalamin (B-12 PO), Take 1 tablet by mouth daily., Disp: , Rfl:  .  eszopiclone (LUNESTA) 2 MG TABS tablet, Take 4 mg by mouth at bedtime. , Disp: , Rfl:  .  ezetimibe-simvastatin (VYTORIN) 10-40 MG tablet, Take 1 tablet by mouth daily., Disp: , Rfl:  .  gabapentin (NEURONTIN) 300 MG capsule, TAKE ONE CAPSULE BY MOUTH NIGHTLY FOR 3-5 DAYS,THEN INCREASE TO 2 CAPSULES NIGHTLY THEREAFTER., Disp: , Rfl: 1 .  losartan (COZAAR) 50 MG tablet, Take 50 mg by mouth daily., Disp: , Rfl:  .  niacin (NIASPAN) 500 MG CR tablet, Take 500 mg by mouth at bedtime. , Disp: , Rfl:  .  Nutritional Supplements (JUICE PLUS FIBRE PO), Take 1 tablet by mouth daily., Disp: , Rfl:  .  omega-3 acid ethyl esters (LOVAZA) 1 g capsule, Take 1 g by mouth daily., Disp: , Rfl:  .  QUEtiapine (SEROQUEL) 100 MG tablet, Take 100 mg by mouth at bedtime., Disp: , Rfl:  .  sertraline (ZOLOFT) 100 MG tablet, Take 200 mg by mouth daily., Disp: , Rfl: 0 .  SUMAtriptan (IMITREX) 100 MG tablet, Take 1 tablet (100 mg total) by mouth once as needed for migraine. May repeat in 2 hours if headache persists or  recurs., Disp: 10 tablet, Rfl: 11  PAST MEDICAL HISTORY: Past Medical History:  Diagnosis Date  . Anemia    has received iron infusions  . Cancer ([place])   . Carpal tunnel syndrome   . Headache   . Hypertension   . Insomnia   . Obstructive sleep apnea   . PONV (postoperative nausea and vomiting)   . Skin cancer, basal cell   . Vision loss of left eye    history of lazy eye    PAST SURGICAL HISTORY: Past Surgical History:  Procedure Laterality Date  . APPENDECTOMY    . BILATERAL CARPAL TUNNEL RELEASE    . CESAREAN SECTION    . DILITATION &  CURRETTAGE/HYSTROSCOPY WITH NOVASURE ABLATION N/A 04/03/2016   Procedure: DILATATION & CURETTAGE/HYSTEROSCOPY WITH NOVASURE ABLATION;  Surgeon: Brien Few, MD;  Location: [facility];  Service: Gynecology;  Laterality: N/A;  . GASTRIC BYPASS      FAMILY HISTORY: Family History  Problem Relation Age of Onset  . Lung disease Mother   . Heart disease Father     SOCIAL HISTORY:  Social History   Socioeconomic History  . Marital status: Married    Spouse name: Not on file  . Number of children: Not on file  . Years of education: Not on file  . Highest education level: Not on file  Occupational History  . Not on file  Social Needs  . Financial resource strain: Not on file  . Food insecurity:    Worry: Not on file    Inability: Not on file  . Transportation needs:    Medical: Not on file    Non-medical: Not on file  Tobacco Use  . Smoking status: Never Smoker  . Smokeless tobacco: Never Used  Substance and Sexual Activity  . Alcohol use: Yes    Alcohol/week: 1.0 standard drinks    Types: 1 Glasses of wine per week    Comment: wine rarely  . Drug use: No  . Sexual activity: Not on file  Lifestyle  . Physical activity:    Days per week: Not on file    Minutes per session: Not on file  . Stress: Not on file  Relationships  . Social connections:    Talks on phone: Not on file    Gets together: Not on file    Attends  religious service: Not on file    Active member of club or organization: Not on file    Attends meetings of clubs or organizations: Not on file    Relationship status: Not on file  . Intimate partner violence:    Fear of current or ex partner: Not on file    Emotionally abused: Not on file    Physically abused: Not on file    Forced sexual activity: Not on file  Other Topics Concern  . Not on file  Social History Narrative  . Not on file     PHYSICAL EXAM  There were no vitals filed for this visit.  There is no height or weight on file to calculate BMI.   General: The patient is well-developed and well-nourished and in no acute distress  HEENT: Head is normocephalic and atraumatic. Pharynx is nonerythematous. There is mild crowding ([place] 2) The neck is supple   Neurologic Exam  Mental status: The patient is alert and oriented x 3 at the time of the examination. The patient has apparent normal recent and remote memory, with an apparently normal attention span and concentration ability.   Speech is normal.  Cranial nerves: Extraocular movements are full.  Facial strength and sensation is normal.  Trapezius strength is normal..  The tongue is midline, and the patient has symmetric elevation of the soft palate. No obvious hearing deficits are noted.  Motor: Muscle bulk is normal.  Muscle tone is normal.  Strength is 5/5.  Sensory: She has intact sensation to touch in the arms and legs..  Coordination: Finger-nose-finger and heel-to-shin is performed well.  Gait and station: Station is normal.   Gait and tandem gait are normal..  Romberg is negative.  .    Reflexes: Deep tendon reflexes are symmetric and normal bilaterally.    Marland Kitchen  DIAGNOSTIC DATA (LABS, IMAGING, TESTING) - I reviewed patient records, labs, notes, testing and imaging myself where available.  Lab Results  Component Value Date   WBC 6.8 03/29/2016   HGB 11.9 (L) 03/29/2016   HCT 35.0 (L) 03/29/2016     MCV 91.1 03/29/2016   PLT 261 03/29/2016      Component Value Date/Time   NA 134 (L) 03/29/2016 1135   K 3.5 03/29/2016 1135   CL 104 03/29/2016 1135   CO2 25 03/29/2016 1135   GLUCOSE 85 03/29/2016 1135   BUN 16 03/29/2016 1135   CREATININE 0.87 03/29/2016 1135   CALCIUM 9.2 03/29/2016 1135   GFRNONAA >60 03/29/2016 1135   GFRAA >60 03/29/2016 1135       ASSESSMENT AND PLAN  OSA on CPAP  Common migraine without intractability  Insomnia, unspecified type  Restless leg syndrome  Anxiety, generalized  1.   Use CPAP nightly.  She will have her DME company contact us if a signature is needed for more supplies. 2.   She will continue Lunesta and her other medications are chronic insomnia. 3.    Imitrex as needed migraine. 4.   She will return to see me in 12-18 months or sooner if there are new or worsening neurologic symptoms.   Deborahann Poteat A. Felecia Shelling, MD, PhD 3/54/5625, 6:38 PM Certified in Neurology, Clinical Neurophysiology, Sleep Medicine, Pain Medicine and Neuroimaging  [facility] [address] [place], [place] [address] [phone]

## 2018-07-03 ENCOUNTER — Encounter: Payer: Self-pay | Admitting: Psychiatry

## 2018-07-03 ENCOUNTER — Ambulatory Visit (INDEPENDENT_AMBULATORY_CARE_PROVIDER_SITE_OTHER): Payer: BLUE CROSS/BLUE SHIELD | Admitting: Psychiatry

## 2018-07-03 VITALS — BP 129/83 | HR 62

## 2018-07-03 DIAGNOSIS — G4700 Insomnia, unspecified: Secondary | ICD-10-CM | POA: Diagnosis not present

## 2018-07-03 DIAGNOSIS — F411 Generalized anxiety disorder: Secondary | ICD-10-CM | POA: Diagnosis not present

## 2018-07-03 DIAGNOSIS — F329 Major depressive disorder, single episode, unspecified: Secondary | ICD-10-CM

## 2018-07-03 DIAGNOSIS — F5101 Primary insomnia: Secondary | ICD-10-CM | POA: Diagnosis not present

## 2018-07-03 DIAGNOSIS — F32A Depression, unspecified: Secondary | ICD-10-CM

## 2018-07-03 MED ORDER — GABAPENTIN 300 MG PO CAPS: 300.0000 mg | ORAL_CAPSULE | Freq: Every day | ORAL | 1 refills | Status: DC

## 2018-07-03 MED ORDER — ESZOPICLONE 3 MG PO TABS: 3.0000 mg | ORAL_TABLET | Freq: Every day | ORAL | 2 refills | Status: DC

## 2018-07-03 MED ORDER — SERTRALINE HCL 100 MG PO TABS: 200.0000 mg | ORAL_TABLET | Freq: Every day | ORAL | 1 refills | Status: DC

## 2018-07-03 NOTE — Progress Notes (Signed)
Joan West [id number] 1965/12/24 52 y.o.  Subjective:   Patient ID:  Joan West is a 52 y.o. (DOB 1965/11/02) female.  Chief Complaint: No chief complaint on file.   HPI Joan West presents to the office today for follow-up of insomnia, depression, and insomnia. 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 She reports that she was physically exhausted at the end of the day. She reports improved work life balance with new position. She reports that her mood has been stable. Reports that anxiety has been controlled. She reports that others have noticed that she is happier. She reports adequate sleep overall with occasional middle of the night awakenings and no longer experiences "panic" with middle of the night awakenings. She reports that she is able to fall asleep easier since she does not have anticipatory anxiety about the next day. Energy and motivation have improved, especially for tasks around the house. She reports that her appetite has been stable. Concentration is adequate. Denies SI.   Past medication trials: Cymbalta-helpful for depression but not anxiety Sertraline-effective for mood and anxiety Citalopram Seroquel Trazodone-ineffective Melatonin-ineffective Benadryl-ineffective for insomnia Ambien-parasomnias Trileptal-ineffective for insomnia or anxiety Gabapentin Lunesta   Review of Systems:  Review of Systems  Musculoskeletal: Negative for gait problem.  Neurological: Negative for tremors.  Psychiatric/Behavioral:       Please refer to HPI    Medications: I have reviewed the patient's current medications.  Current Outpatient Medications  Medication Sig Dispense Refill  . Cholecalciferol (VITAMIN D PO) Take  1 tablet by mouth daily.    . Coenzyme Q10 (CO Q 10 PO) Take by mouth.    . Cyanocobalamin (B-12 PO) Take 1 tablet by mouth daily.    Derrill Memo ON 09/12/2018] Eszopiclone 3 MG TABS Take 1 tablet (3 mg total) by mouth at bedtime. 30 tablet 2  . ezetimibe-simvastatin (VYTORIN) 10-40 MG tablet Take 1 tablet by mouth daily.    Marland Kitchen losartan (COZAAR) 50 MG tablet Take 100 mg by mouth daily.     . Melatonin 1 MG CAPS Take by mouth.    . niacin (NIASPAN) 500 MG CR tablet Take 500 mg by mouth at bedtime.     . Nutritional Supplements (JUICE PLUS FIBRE PO) Take 1 tablet by mouth daily.    . Omega-3 Fatty Acids (FISH OIL) 1000 MG CAPS Take by mouth.    . QUEtiapine (SEROQUEL) 100 MG tablet Take 100 mg by mouth at bedtime.    . sertraline (ZOLOFT) 100 MG tablet Take 2 tablets (200 mg total) by mouth daily. 180 tablet 1  . SUMAtriptan (IMITREX) 100 MG tablet Take 1 tablet (100 mg total) by mouth once as needed for migraine. May repeat in 2 hours if headache persists or recurs. 10 tablet 11  . TURMERIC PO Take by mouth.    . gabapentin (NEURONTIN) 300 MG capsule Take 1 capsule (300 mg total) by mouth at bedtime. 90 capsule 1  . omega-3 acid ethyl esters (LOVAZA) 1 g capsule Take 1 g by mouth daily.     No current facility-administered medications for this visit.     Medication Side Effects: None  Allergies:  Allergies  Allergen Reactions  . Sulfa Antibiotics Rash    fever  Past Medical History:  Diagnosis Date  . Anemia    has received iron infusions  . Cancer ([place])   . Carpal tunnel syndrome   . Headache   . Hypertension   . Insomnia   . Migraine   . Obstructive sleep apnea   . PONV (postoperative nausea and vomiting)   . Skin cancer, basal cell   . Vision loss of left eye    history of lazy eye    Family History  Problem Relation Age of Onset  . Lung disease Mother   . Schizophrenia Mother   . Drug abuse Mother   . Heart disease Father   . Drug abuse Father   . Bipolar disorder  Father   . Anxiety disorder Son     Social History   Socioeconomic History  . Marital status: Married    Spouse name: Not on file  . Number of children: Not on file  . Years of education: Not on file  . Highest education level: Not on file  Occupational History  . Not on file  Social Needs  . Financial resource strain: Not on file  . Food insecurity:    Worry: Not on file    Inability: Not on file  . Transportation needs:    Medical: Not on file    Non-medical: Not on file  Tobacco Use  . Smoking status: Never Smoker  . Smokeless tobacco: Never Used  Substance and Sexual Activity  . Alcohol use: Yes    Alcohol/week: 1.0 standard drinks    Types: 1 Glasses of wine per week    Comment: wine rarely  . Drug use: No  . Sexual activity: Not on file  Lifestyle  . Physical activity:    Days per week: Not on file    Minutes per session: Not on file  . Stress: Not on file  Relationships  . Social connections:    Talks on phone: Not on file    Gets together: Not on file    Attends religious service: Not on file    Active member of club or organization: Not on file    Attends meetings of clubs or organizations: Not on file    Relationship status: Not on file  . Intimate partner violence:    Fear of current or ex partner: Not on file    Emotionally abused: Not on file    Physically abused: Not on file    Forced sexual activity: Not on file  Other Topics Concern  . Not on file  Social History Narrative  . Not on file    Past Medical History, Surgical history, Social history, and Family history were reviewed and updated as appropriate.   Please see review of systems for further details on the patient's review from today.   Objective:   Physical Exam:  BP 129/83   Pulse 62   Physical Exam  Constitutional: She is oriented to person, place, and time. She appears well-developed. No distress.  Musculoskeletal: She exhibits no deformity.  Neurological: She is alert and  oriented to person, place, and time. Coordination normal.  Psychiatric: She has a normal mood and affect. Her speech is normal and behavior is normal. Judgment and thought content normal. Her mood appears not anxious. Her affect is not angry, not blunt, not labile and not inappropriate. Cognition and memory are normal. She does not exhibit a depressed mood. She expresses no homicidal and no suicidal ideation. She expresses no suicidal plans and  no homicidal plans.  Insight intact. No auditory or visual hallucinations. No delusions.     Lab Review:     Component Value Date/Time   NA 134 (L) 03/29/2016 1135   K 3.5 03/29/2016 1135   CL 104 03/29/2016 1135   CO2 25 03/29/2016 1135   GLUCOSE 85 03/29/2016 1135   BUN 16 03/29/2016 1135   CREATININE 0.87 03/29/2016 1135   CALCIUM 9.2 03/29/2016 1135   GFRNONAA >60 03/29/2016 1135   GFRAA >60 03/29/2016 1135       Component Value Date/Time   WBC 6.8 03/29/2016 1135   RBC 3.84 (L) 03/29/2016 1135   HGB 11.9 (L) 03/29/2016 1135   HCT 35.0 (L) 03/29/2016 1135   PLT 261 03/29/2016 1135   MCV 91.1 03/29/2016 1135   MCH 31.0 03/29/2016 1135   MCHC 34.0 03/29/2016 1135   RDW 13.3 03/29/2016 1135   LYMPHSABS 3.0 03/15/2014 0540   MONOABS 0.6 03/15/2014 0540   EOSABS 0.2 03/15/2014 0540   BASOSABS 0.0 03/15/2014 0540    No results found for: POCLITH, LITHIUM   No results found for: PHENYTOIN, PHENOBARB, VALPROATE, CBMZ   .res Assessment: Plan:   Patient seen for 30 minutes and greater than 50% of visit spent reviewing medications and discussing indications, to include discussing patient's questions about melatonin.  Discussed that prescription would be sent to pharmacy with future date for Lunesta 3 mg p.o. nightly since she has refills remaining on current prescription.  Patient reports that she has Seroquel remaining and has been taking half of a 100 mg tablet.  She reports that she will contact office if she requires refill of Seroquel  prior to next visit.  We will continue sertraline 200 mg daily for mood and anxiety.  Will also continue gabapentin 300 mg at bedtime for insomnia and anxiety.  Patient reports that she will contact office for sooner appointment with any worsening signs and symptoms. Generalized anxiety disorder - Plan: sertraline (ZOLOFT) 100 MG tablet  Insomnia, unspecified type - Plan: gabapentin (NEURONTIN) 300 MG capsule, Eszopiclone 3 MG TABS  Depression, unspecified depression type - Plan: sertraline (ZOLOFT) 100 MG tablet  Primary insomnia  Please see After Visit Summary for patient specific instructions.  Future Appointments  Date Time Provider [place]  08/21/2018  4:00 PM Lina Sayre, [place] CP-CP None  09/04/2018  4:00 PM Lina Sayre, [place] CP-CP None  11/06/2018  3:30 PM Thayer Headings, PMHNP CP-CP None    No orders of the defined types were placed in this encounter.     -------------------------------

## 2018-08-21 ENCOUNTER — Ambulatory Visit: Payer: Self-pay | Admitting: Psychiatry

## 2018-08-27 ENCOUNTER — Encounter: Payer: Self-pay | Admitting: Gastroenterology

## 2018-09-04 ENCOUNTER — Ambulatory Visit (INDEPENDENT_AMBULATORY_CARE_PROVIDER_SITE_OTHER): Payer: BLUE CROSS/BLUE SHIELD | Admitting: Psychiatry

## 2018-09-04 ENCOUNTER — Encounter: Payer: Self-pay | Admitting: Psychiatry

## 2018-09-04 DIAGNOSIS — F411 Generalized anxiety disorder: Secondary | ICD-10-CM

## 2018-09-04 NOTE — Progress Notes (Signed)
      [facility] Counselor/Therapist Progress Note  Patient ID: Joan West, MRN: [id number],    Date: 09/04/2018  Time Spent: 51 minutes  Treatment Type: Individual Therapy  Reported Symptoms: Anxious Mood and Sleep disturbance, eating issues  Mental Status Exam:  Appearance:   Well Groomed     Behavior:  Sharing  Motor:  Normal  Speech/Language:   Normal Rate  Affect:  Appropriate  Mood:  anxious  Thought process:  circumstantial  Thought content:    WNL  Sensory/Perceptual disturbances:    WNL  Orientation:  oriented to person, place and time/date  Attention:  Good  Concentration:  Good  Memory:  WNL  Fund of knowledge:   Good  Insight:    Good  Judgment:   Good  Impulse Control:  Good   Risk Assessment: Danger to Self:  No Self-injurious Behavior: No Danger to Others: No Duty to Warn:no Physical Aggression / Violence:No  Access to Firearms a concern: No  Gang Involvement:No   Subjective: Patient was present for session.  Patient reported that she has had a job change which is helped decrease some of her anxiety.  She explained the situation and how she took a cut in pay to have less responsibility.  Patient reported that the decrease in stress has been worth it for her.  She went on to share her biggest concern currently is that she finds herself eating things that she knows are not good for her and she reported she cannot seem to stop drinking diet sun drop which she knows is very bad for her.  Discussed some CBT skills to help her start focusing on her thoughts to help change her behaviors.  Patient was encouraged to recognize even if she does not feel a certain way she she can tell herself what she wants and eventually things will start moving in a more positive direction.  Also discussed different strategies to structure her life differently and engage her family in changing things at the house to help her maintain some motivation.  Patient reported feeling plans  from session would be helpful and a willingness to follow through.  Agreed to work on more strategies at next session if needed.  Interventions: Cognitive Behavioral Therapy and Solution-Oriented/Positive Psychology  Diagnosis:   ICD-10-CM   1. Generalized anxiety disorder F41.1     Plan: 1.  Patient to continue to engage in individual counseling 2-4 times a month or as needed. 2.  Patient to identify and apply CBT, coping skills learned in session to decrease anxiety symptoms and work on healthy habits. 3.  Patient to contact this office, go to the local ED or call 911 if a crisis or emergency develops between visits.  Lina Sayre, [place]

## 2018-09-10 ENCOUNTER — Ambulatory Visit (AMBULATORY_SURGERY_CENTER): Payer: Self-pay | Admitting: *Deleted

## 2018-09-10 ENCOUNTER — Encounter: Payer: Self-pay | Admitting: Gastroenterology

## 2018-09-10 VITALS — Ht 63.5 in | Wt 223.0 lb

## 2018-09-10 DIAGNOSIS — Z1211 Encounter for screening for malignant neoplasm of colon: Secondary | ICD-10-CM

## 2018-09-10 MED ORDER — NA SULFATE-K SULFATE-MG SULF 17.5-3.13-1.6 GM/177ML PO SOLN: 1.0000 | Freq: Once | ORAL | 0 refills | Status: AC

## 2018-09-10 NOTE — Progress Notes (Signed)
No egg or soy allergy known to patient  No issues with past sedation with any surgeries  or procedures, no intubation problems  No diet pills per patient No home 02 use per patient  No blood thinners per patient  Pt denies issues with constipation  No A fib or A flutter  EMMI video sent to pt's e mail  Suprep $15 coupon to pt

## 2018-09-18 ENCOUNTER — Ambulatory Visit (INDEPENDENT_AMBULATORY_CARE_PROVIDER_SITE_OTHER): Payer: BLUE CROSS/BLUE SHIELD | Admitting: Psychiatry

## 2018-09-18 ENCOUNTER — Encounter: Payer: Self-pay | Admitting: Psychiatry

## 2018-09-18 DIAGNOSIS — F411 Generalized anxiety disorder: Secondary | ICD-10-CM | POA: Diagnosis not present

## 2018-09-18 NOTE — Progress Notes (Signed)
      [facility] Counselor/Therapist Progress Note  Patient ID: Joan West, MRN: [id number],    Date: 09/18/2018  Time Spent: 51 minutes  Treatment Type: Individual Therapy  Reported Symptoms: Anxious Mood and Fatigue  Mental Status Exam:  Appearance:   Casual     Behavior:  Appropriate  Motor:  Normal  Speech/Language:   Normal Rate  Affect:  Congruent  Mood:  anxious  Thought process:  normal  Thought content:    WNL  Sensory/Perceptual disturbances:    WNL  Orientation:  oriented to person, place and time/date  Attention:  Good  Concentration:  Good  Memory:  WNL  Fund of knowledge:   Good  Insight:    Good  Judgment:   Good  Impulse Control:  Good   Risk Assessment: Danger to Self:  No Self-injurious Behavior: No Danger to Others: No Duty to Warn:no Physical Aggression / Violence:No  Access to Firearms a concern: No  Gang Involvement:No   Subjective: Patient was present for session.  She reported she is improving on her eating but it is still an issue.  She is still working on decreasing her [employer] intact.  Currently she has a UTI but is on antibiotics.  Patient was reminded of the importance of drinking water and continuing to work on decreasing her sudden drop intake.  Patient went on to explain that she is having anxiety over her home due to things not being in place.  Did E MDR set on her house, suds level 8, negative cognition "I am not enough", felt sadness and guilt in her head.  Patient was able to reduce suds level to 3.  She was able to realize she needs to change her self talk and focus on what is important for her.  She also recognized that if she breaks things down into smaller pieces she can accomplish what she needs to at her house rather than saying everything and getting overwhelmed.  Patient was encouraged to work on her self talk when it comes to her house as well as decreasing her sun drop over the next 2 weeks.  Interventions:  Solution-Oriented/Positive Psychology and Eye Movement Desensitization and Reprocessing (EMDR), CBT  Diagnosis:   ICD-10-CM   1. Generalized anxiety disorder F41.1     Plan: 1.  Patient to continue to engage in individual counseling 2-4 times a month or as needed. 2.  Patient to identify and apply CBT, coping skills learned in session to decrease  anxiety symptoms. 3.  Patient to contact this office, go to the local ED or call 911 if a crisis or emergency develops between visits.  Lina Sayre, [place]

## 2018-09-23 ENCOUNTER — Ambulatory Visit (AMBULATORY_SURGERY_CENTER): Payer: BLUE CROSS/BLUE SHIELD | Admitting: Gastroenterology

## 2018-09-23 ENCOUNTER — Encounter: Payer: Self-pay | Admitting: Gastroenterology

## 2018-09-23 VITALS — BP 106/58 | HR 56 | Temp 97.5°F | Resp 13

## 2018-09-23 DIAGNOSIS — D125 Benign neoplasm of sigmoid colon: Secondary | ICD-10-CM | POA: Diagnosis not present

## 2018-09-23 DIAGNOSIS — Z1211 Encounter for screening for malignant neoplasm of colon: Secondary | ICD-10-CM | POA: Diagnosis not present

## 2018-09-23 MED ORDER — SODIUM CHLORIDE 0.9 % IV SOLN: 500.0000 mL | Freq: Once | INTRAVENOUS | Status: DC

## 2018-09-23 NOTE — Progress Notes (Signed)
No problems noted in the recovery room. maw 

## 2018-09-23 NOTE — Op Note (Signed)
[place] Patient Name: Joan West Procedure Date: 09/23/2018 8:35 AM MRN: [id number] Endoscopist: Mauri Pole , MD Age: 53 Referring MD:  Date of Birth: Aug 16, 1966 Gender: Female Account #: [id number] Procedure:                Colonoscopy Indications:              Screening for colorectal malignant neoplasm Medicines:                Monitored Anesthesia Care Procedure:                Pre-Anesthesia Assessment:                           - Prior to the procedure, a History and Physical                            was performed, and patient medications and                            allergies were reviewed. The patient's tolerance of                            previous anesthesia was also reviewed. The risks                            and benefits of the procedure and the sedation                            options and risks were discussed with the patient.                            All questions were answered, and informed consent                            was obtained. Prior Anticoagulants: The patient has                            taken no previous anticoagulant or antiplatelet                            agents. ASA Grade Assessment: III - A patient with                            severe systemic disease. After reviewing the risks                            and benefits, the patient was deemed in                            satisfactory condition to undergo the procedure.                           After obtaining informed consent, the colonoscope  was passed under direct vision. Throughout the                            procedure, the patient's blood pressure, pulse, and                            oxygen saturations were monitored continuously. The                            Colonoscope was introduced through the anus and                            advanced to the the cecum, identified by                            appendiceal orifice  and ileocecal valve. The                            colonoscopy was performed without difficulty. The                            patient tolerated the procedure well. The quality                            of the bowel preparation was excellent. The                            ileocecal valve, appendiceal orifice, and rectum                            were photographed. Scope In: 8:42:27 AM Scope Out: 9:06:08 AM Scope Withdrawal Time: 0 hours 10 minutes 21 seconds  Total Procedure Duration: 0 hours 23 minutes 41 seconds  Findings:                 The perianal and digital rectal examinations were                            normal.                           A 12 mm polyp was found in the sigmoid colon. The                            polyp was pedunculated. The polyp was removed with                            a hot snare. Resection and retrieval were complete.                           A few small-mouthed diverticula were found in the                            sigmoid colon.  Non-bleeding internal hemorrhoids were found during                            retroflexion. The hemorrhoids were small. Complications:            No immediate complications. Estimated Blood Loss:     Estimated blood loss was minimal. Impression:               - One 12 mm polyp in the sigmoid colon, removed                            with a hot snare. Resected and retrieved.                           - Diverticulosis in the sigmoid colon.                           - Non-bleeding internal hemorrhoids. Recommendation:           - Patient has a contact number available for                            emergencies. The signs and symptoms of potential                            delayed complications were discussed with the                            patient. Return to normal activities tomorrow.                            Written discharge instructions were provided to the                             patient.                           - Resume previous diet.                           - Continue present medications.                           - Await pathology results.                           - Repeat colonoscopy in 3 years for surveillance                            based on pathology results. Mauri Pole, MD 09/23/2018 9:08:55 AM This report has been signed electronically.

## 2018-09-23 NOTE — Progress Notes (Signed)
PT taken to PACU. Monitors in place. VSS. Report given to RN. 

## 2018-09-23 NOTE — Patient Instructions (Signed)
YOU HAD AN ENDOSCOPIC PROCEDURE TODAY AT [facility]:   Refer to the procedure report that was given to you for any specific questions about what was found during the examination.  If the procedure report does not answer your questions, please call your gastroenterologist to clarify.  If you requested that your care partner not be given the details of your procedure findings, then the procedure report has been included in a sealed envelope for you to review at your convenience later.  YOU SHOULD EXPECT: Some feelings of bloating in the abdomen. Passage of more gas than usual.  Walking can help get rid of the air that was put into your GI tract during the procedure and reduce the bloating. If you had a lower endoscopy (such as a colonoscopy or flexible sigmoidoscopy) you may notice spotting of blood in your stool or on the toilet paper. If you underwent a bowel prep for your procedure, you may not have a normal bowel movement for a few days.  Please Note:  You might notice some irritation and congestion in your nose or some drainage.  This is from the oxygen used during your procedure.  There is no need for concern and it should clear up in a day or so.  SYMPTOMS TO REPORT IMMEDIATELY:   Following lower endoscopy (colonoscopy or flexible sigmoidoscopy):  Excessive amounts of blood in the stool  Significant tenderness or worsening of abdominal pains  Swelling of the abdomen that is new, acute  Fever of 100F or higher  For urgent or emergent issues, a gastroenterologist can be reached at any hour by calling [phone].   DIET:  We do recommend a small meal at first, but then you may proceed to your regular diet.  Drink plenty of fluids but you should avoid alcoholic beverages for 24 hours.  ACTIVITY:  You should plan to take it easy for the rest of today and you should NOT DRIVE or use heavy machinery until tomorrow (because of the sedation medicines used during the test).     FOLLOW UP: Our staff will call the number listed on your records the next business day following your procedure to check on you and address any questions or concerns that you may have regarding the information given to you following your procedure. If we do not reach you, we will leave a message.  However, if you are feeling well and you are not experiencing any problems, there is no need to return our call.  We will assume that you have returned to your regular daily activities without incident.  If any biopsies were taken you will be contacted by phone or by letter within the next 1-3 weeks.  Please call us at [phone] if you have not heard about the biopsies in 3 weeks.    SIGNATURES/CONFIDENTIALITY: You and/or your care partner have signed paperwork which will be entered into your electronic medical record.  These signatures attest to the fact that that the information above on your After Visit Summary has been reviewed and is understood.  Full responsibility of the confidentiality of this discharge information lies with you and/or your care-partner.   Handouts were given to your care partner on polyps, diverticulosis, and a hemorrhoids. You may resume your current medications today. Await biopsy results. Please call if any questions or concerns.

## 2018-09-23 NOTE — Progress Notes (Signed)
Called to room to assist during endoscopic procedure.  Patient ID and intended procedure confirmed with present staff. Received instructions for my participation in the procedure from the performing physician.  

## 2018-09-23 NOTE — Progress Notes (Signed)
Pt's states no medical or surgical changes since previsit or office visit. 

## 2018-09-24 ENCOUNTER — Telehealth: Payer: Self-pay

## 2018-09-24 NOTE — Telephone Encounter (Signed)
Follow up call x 2, left a voicemail. 

## 2018-09-24 NOTE — Telephone Encounter (Signed)
First post procedure follow up call, no answer 

## 2018-09-29 ENCOUNTER — Encounter: Payer: Self-pay | Admitting: Gastroenterology

## 2018-10-02 ENCOUNTER — Ambulatory Visit: Payer: BLUE CROSS/BLUE SHIELD | Admitting: Psychiatry

## 2018-10-24 ENCOUNTER — Ambulatory Visit: Payer: BLUE CROSS/BLUE SHIELD | Admitting: Psychiatry

## 2018-11-06 ENCOUNTER — Ambulatory Visit: Payer: BLUE CROSS/BLUE SHIELD | Admitting: Psychiatry

## 2018-11-07 ENCOUNTER — Encounter: Payer: Self-pay | Admitting: Psychiatry

## 2018-11-07 ENCOUNTER — Other Ambulatory Visit: Payer: Self-pay

## 2018-11-07 ENCOUNTER — Ambulatory Visit (INDEPENDENT_AMBULATORY_CARE_PROVIDER_SITE_OTHER): Payer: BLUE CROSS/BLUE SHIELD | Admitting: Psychiatry

## 2018-11-07 DIAGNOSIS — F32A Depression, unspecified: Secondary | ICD-10-CM

## 2018-11-07 DIAGNOSIS — F329 Major depressive disorder, single episode, unspecified: Secondary | ICD-10-CM

## 2018-11-07 DIAGNOSIS — F411 Generalized anxiety disorder: Secondary | ICD-10-CM

## 2018-11-07 DIAGNOSIS — G4700 Insomnia, unspecified: Secondary | ICD-10-CM | POA: Diagnosis not present

## 2018-11-07 MED ORDER — ESZOPICLONE 3 MG PO TABS: 3.0000 mg | ORAL_TABLET | Freq: Every day | ORAL | 3 refills | Status: DC

## 2018-11-07 MED ORDER — SERTRALINE HCL 100 MG PO TABS: 200.0000 mg | ORAL_TABLET | Freq: Every day | ORAL | 1 refills | Status: DC

## 2018-11-07 NOTE — Progress Notes (Signed)
Joan West [id number] July 30, 1966 53 y.o.  Subjective:   Patient ID:  Joan West is a 53 y.o. (DOB 11-06-1965) female.  Chief Complaint:  Chief Complaint  Patient presents with  . Follow-up    h/o Insomnia, Anxiety, and Depression    HPI Joan West presents to the office today for follow-up of depression, anxiety, and insomnia. She reports that one night recently she had some trouble sleeping and thought it might be related to some anxiety r/t COVID 19. She reports that she has not had significant anxiety. 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 She reports that overall her anxiety has been well controlled. She reports that her mood has been "good for the most part." Denies recent depressed mood. She reports some middle of the night awakenings. Reports occasionally she will drink a small amount of wine and then will return to sleep. She reports an adequate amount of sleep and estimates sleeping at least 8 hours most nights. Appetite has been ok. She reports that her energy is low. Has been trying to walk with her children daily. She reports that her motivation is low and thinks about projects she needs to do around the house and wants to read instead. She reports that she is doing general household chores. She reports adequate concentration. Denies SI.   Currently working from home due to [employer] 19. 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   She reports that she tries not to take Gabapentin unless she has restless legs. She reports occasional restless legs.   Past medication trials: Cymbalta-helpful for depression but not anxiety Sertraline-effective for mood and anxiety Citalopram Seroquel Trazodone-ineffective Melatonin-ineffective Benadryl-ineffective for insomnia Ambien-parasomnias Trileptal-ineffective for insomnia or anxiety Gabapentin Lunesta   Review of Systems:  Review  of Systems  Gastrointestinal: Negative.   Musculoskeletal: Positive for joint swelling. Negative for gait problem.  Neurological: Negative for tremors.       Reports that she will notice an occasional finger twitch.  Psychiatric/Behavioral:       Please refer to HPI    Medications: I have reviewed the patient's current medications.  Current Outpatient Medications  Medication Sig Dispense Refill  . Cholecalciferol (VITAMIN D PO) Take 1 tablet by mouth daily.    . Coenzyme Q10 (CO Q 10 PO) Take by mouth.    . Cyanocobalamin (B-12 PO) Take 1 tablet by mouth daily.    Marland Kitchen ELDERBERRY PO Take by mouth.    Derrill Memo ON 12/08/2018] Eszopiclone 3 MG TABS Take 1 tablet (3 mg total) by mouth at bedtime for 30 days. 30 tablet 3  . ezetimibe-simvastatin (VYTORIN) 10-40 MG tablet Take 1 tablet by mouth daily.    Marland Kitchen gabapentin (NEURONTIN) 300 MG capsule Take 1 capsule (300 mg total) by mouth at bedtime. 90 capsule 1  . losartan (COZAAR) 50 MG tablet Take 50 mg by mouth 2 (two) times daily.     . Multiple Vitamin (MULTIVITAMIN) tablet Take 1 tablet by mouth daily. Centrum Silver daily    . Niacin (ENDUR-ACIN PO) Take 500 mg by mouth daily.    . Nutritional Supplements (JUICE PLUS FIBRE PO) Take 1 tablet by mouth daily.    . Omega-3 Fatty Acids (FISH OIL) 1000 MG CAPS Take by mouth.    . QUEtiapine (SEROQUEL) 100 MG tablet Take 100 mg by mouth at bedtime. Taking 50-100 mg po QHS    . sertraline (  ZOLOFT) 100 MG tablet Take 2 tablets (200 mg total) by mouth daily. 180 tablet 1  . SUMAtriptan (IMITREX) 100 MG tablet Take 1 tablet (100 mg total) by mouth once as needed for migraine. May repeat in 2 hours if headache persists or recurs. 10 tablet 11  . TURMERIC PO Take by mouth.    . Melatonin 1 MG CAPS Take by mouth.     No current facility-administered medications for this visit.     Medication Side Effects: None  Allergies:  Allergies  Allergen Reactions  . Sulfa Antibiotics Rash    fever    Past  Medical History:  Diagnosis Date  . Anemia    has received iron infusions  . Anxiety   . Arthritis    mid foot arthristis  . Cancer ([place])   . Carpal tunnel syndrome   . Headache   . Hyperlipidemia   . Hypertension   . Insomnia   . Migraine   . Obstructive sleep apnea   . PONV (postoperative nausea and vomiting)   . Skin cancer, basal cell   . Sleep apnea    wears cpap  . Vision loss of left eye    history of lazy eye    Family History  Problem Relation Age of Onset  . Lung disease Mother   . Schizophrenia Mother   . Drug abuse Mother   . Heart disease Father   . Drug abuse Father   . Bipolar disorder Father   . Anxiety disorder Son   . Colon polyps Neg Hx   . Colon cancer Neg Hx   . Esophageal cancer Neg Hx   . Rectal cancer Neg Hx   . Stomach cancer Neg Hx     Social History   Socioeconomic History  . Marital status: Married    Spouse name: Not on file  . Number of children: Not on file  . Years of education: Not on file  . Highest education level: Not on file  Occupational History  . Not on file  Social Needs  . Financial resource strain: Not on file  . Food insecurity:    Worry: Not on file    Inability: Not on file  . Transportation needs:    Medical: Not on file    Non-medical: Not on file  Tobacco Use  . Smoking status: Never Smoker  . Smokeless tobacco: Never Used  Substance and Sexual Activity  . Alcohol use: Yes    Alcohol/week: 1.0 standard drinks    Types: 1 Glasses of wine per week    Comment: wine rarely  . Drug use: No  . Sexual activity: Not on file  Lifestyle  . Physical activity:    Days per week: Not on file    Minutes per session: Not on file  . Stress: Not on file  Relationships  . Social connections:    Talks on phone: Not on file    Gets together: Not on file    Attends religious service: Not on file    Active member of club or organization: Not on file    Attends meetings of clubs or organizations: Not on file     Relationship status: Not on file  . Intimate partner violence:    Fear of current or ex partner: Not on file    Emotionally abused: Not on file    Physically abused: Not on file    Forced sexual activity: Not on file  Other Topics Concern  .  Not on file  Social History Narrative  . Not on file    Past Medical History, Surgical history, Social history, and Family history were reviewed and updated as appropriate.   Please see review of systems for further details on the patient's review from today.   Objective:   Physical Exam:  There were no vitals taken for this visit.  Physical Exam Constitutional:      General: She is not in acute distress.    Appearance: She is well-developed.  Musculoskeletal:        General: No deformity.  Neurological:     Mental Status: She is alert and oriented to person, place, and time.     Coordination: Coordination normal.  Psychiatric:        Attention and Perception: Attention and perception normal. She does not perceive auditory or visual hallucinations.        Mood and Affect: Mood normal. Mood is not anxious or depressed. Affect is not labile, blunt, angry or inappropriate.        Speech: Speech normal.        Behavior: Behavior normal.        Thought Content: Thought content normal. Thought content does not include homicidal or suicidal ideation. Thought content does not include homicidal or suicidal plan.        Cognition and Memory: Cognition and memory normal.        Judgment: Judgment normal.     Comments: Insight intact. No delusions.      Lab Review:     Component Value Date/Time   NA 134 (L) 03/29/2016 1135   K 3.5 03/29/2016 1135   CL 104 03/29/2016 1135   CO2 25 03/29/2016 1135   GLUCOSE 85 03/29/2016 1135   BUN 16 03/29/2016 1135   CREATININE 0.87 03/29/2016 1135   CALCIUM 9.2 03/29/2016 1135   GFRNONAA >60 03/29/2016 1135   GFRAA >60 03/29/2016 1135       Component Value Date/Time   WBC 6.8 03/29/2016 1135    RBC 3.84 (L) 03/29/2016 1135   HGB 11.9 (L) 03/29/2016 1135   HCT 35.0 (L) 03/29/2016 1135   PLT 261 03/29/2016 1135   MCV 91.1 03/29/2016 1135   MCH 31.0 03/29/2016 1135   MCHC 34.0 03/29/2016 1135   RDW 13.3 03/29/2016 1135   LYMPHSABS 3.0 03/15/2014 0540   MONOABS 0.6 03/15/2014 0540   EOSABS 0.2 03/15/2014 0540   BASOSABS 0.0 03/15/2014 0540    No results found for: POCLITH, LITHIUM   No results found for: PHENYTOIN, PHENOBARB, VALPROATE, CBMZ   .res Assessment: Plan:   Will continue current medications since mood, anxiety, and insomnia are well controlled without tolerability issues. Continue Lunesta 3 mg po QHS for insomnia Continue Sertraline 200 mg po qd for depression and anxiety. 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 Continue Gabapentin 300 mg po QHS for restless legs and insomnia.  Insomnia, unspecified type - Plan: Eszopiclone 3 MG TABS  Generalized anxiety disorder - Plan: sertraline (ZOLOFT) 100 MG tablet  Depression, unspecified depression type - Plan: sertraline (ZOLOFT) 100 MG tablet  Please see After Visit Summary for patient specific instructions.  Future Appointments  Date Time Provider [place]  11/26/2018  4:00 PM Lina Sayre, [place] CP-CP None  04/09/2019  4:00 PM Thayer Headings, PMHNP CP-CP None    No orders of the defined types were placed in this encounter.     -------------------------------

## 2018-11-26 ENCOUNTER — Other Ambulatory Visit: Payer: Self-pay

## 2018-11-26 ENCOUNTER — Ambulatory Visit (INDEPENDENT_AMBULATORY_CARE_PROVIDER_SITE_OTHER): Payer: BLUE CROSS/BLUE SHIELD | Admitting: Psychiatry

## 2018-11-26 DIAGNOSIS — F411 Generalized anxiety disorder: Secondary | ICD-10-CM

## 2018-11-26 NOTE — Progress Notes (Signed)
[facility] Counselor/Therapist Progress Note  Patient ID: Joan West, MRN: [id number],    Date: 11/26/2018  Time Spent: 45 minutes Start time 4:16 p.m. and time 5:01 PM Virtual Visit via Telephone Note I connected with patient by a video enabled telemedicine application or telephone, with their informed consent, and verified patient privacy and that I am speaking with the correct person using two identifiers.    I discussed the limitations, risks, security and privacy concerns of performing psychotherapy and management service by telephone and the availability of in person appointments. I also discussed with the patient that there may be a patient responsible charge related to this service. The patient expressed understanding and agreed to proceed.  I discussed the treatment planning with the patient. The patient was provided an opportunity to ask questions and all were answered. The patient agreed with the plan and demonstrated an understanding of the instructions.   The patient was advised to call  our office if  symptoms worsen or feel they are in a crisis state and need immediate contact.  Patient was at home clinician was at her home  Treatment Type: Individual Therapy  Reported Symptoms: anxiety, sleep issues, low motivation  Mental Status Exam:  Appearance:   NA     Behavior:  Sharing  Motor:  NA  Speech/Language:   Normal Rate  Affect:  NA  Mood:  normal  Thought process:  normal  Thought content:    WNL  Sensory/Perceptual disturbances:    WNL  Orientation:  oriented to person, place, time/date and situation  Attention:  Fair  Concentration:  Fair  Memory:  [place] of knowledge:   Fair  Insight:    Fair  Judgment:   Good  Impulse Control:  Good   Risk Assessment: Danger to Self:  No Self-injurious Behavior: No Danger to Others: No Duty to Warn:no Physical Aggression / Violence:No  Access to Firearms a concern: No  Gang Involvement:No    Subjective: Met with patient via phone.  She reported feeling that the WebEx would not be doable with her Internet access.  Patient went on to explain that the situation has been very difficult for her.  She has had anxiety about her family and keeping them safe.  She also gets frustrated because she does not seem to have the motivation to do everything she needs to do around her house.  Patient also explained that the time together has increased different issues with she and her daughter.  She is noticing that her issues with people eating and noises seems to be escalating and that is creating stress for patient.  Discussed different strategies that she could use with her daughter to try and help her deal with the situation.  Also encouraged her to talk to her daughter about some CBT skills that patient has learned and time together to get her daughter to work on her thoughts.  Patient was reminded that thoughts lead to feelings lead to behaviors and so practicing those things with her daughter can help her as well.  Different strategies to help patient with goalsetting and motivation were also discussed in session and plans were developed.  Patient reported feeling positive at the end of session and that the plans may be helpful so she agreed to implement them.  Interventions: Cognitive Behavioral Therapy and Solution-Oriented/Positive Psychology  Diagnosis:   ICD-10-CM   1. Generalized anxiety disorder F41.1     Plan: 1.  Patient to  continue to engage in individual counseling 2-4 times a month or as needed. 2.  Patient to identify and apply CBT, coping skills learned in session to decrease anxiety symptoms. 3.  Patient to contact this office, go to the local ED or call 911 if a crisis or emergency develops between visits.  Lina Sayre, [facility]   This record has been created using [employer].  Chart creation errors have been sought, but may not always have been located and corrected. Such  creation errors do not reflect on the standard of medical care.

## 2018-11-27 ENCOUNTER — Other Ambulatory Visit: Payer: Self-pay | Admitting: Psychiatry

## 2018-11-28 ENCOUNTER — Encounter: Payer: Self-pay | Admitting: Psychiatry

## 2019-01-01 ENCOUNTER — Other Ambulatory Visit: Payer: Self-pay | Admitting: Psychiatry

## 2019-01-19 ENCOUNTER — Telehealth: Payer: Self-pay | Admitting: Psychiatry

## 2019-01-19 DIAGNOSIS — G4700 Insomnia, unspecified: Secondary | ICD-10-CM

## 2019-01-19 MED ORDER — SUVOREXANT 20 MG PO TABS: 20.0000 mg | ORAL_TABLET | Freq: Every day | ORAL | 1 refills | Status: DC

## 2019-01-19 NOTE — Telephone Encounter (Signed)
She reports that she had a few nights of poor sleep and has since developed anxiety about not sleeping. "I've become obsessed with it." She reports that she has tried multiple strategies to help sleep, to include non-pharmacological strategies such as yoga nidra, deep breathing, etc. She reports that her anxiety has been increased in general.  Past medication trials: Cymbalta-helpful for depression but not anxiety Sertraline-effective for mood and anxiety Buspar Citalopram Seroquel Trazodone-ineffective Melatonin-ineffective Benadryl-ineffective for insomnia Ambien-parasomnias Trileptal-ineffective for insomnia or anxiety Gabapentin Lunesta   Discussed potential benefits, risks, and side effects of Belsomra.  Patient agrees to trial of Belsomra.  Discussed that Belsomra could be taken in addition to her current medications.  Discussed downloading savings card to reduce co-pay.  Encourage patient to contact office if sleep and anxiety do not improve or worsen.

## 2019-01-19 NOTE — Telephone Encounter (Signed)
Karely called to report that she is not sleeping well.  Wants to know if she can switch up her medication - Lunesta.  Wants to go back to 4mg  or get something else.  Please call to discuss. appt 04/09/19.  [facility] [address]

## 2019-03-09 ENCOUNTER — Other Ambulatory Visit: Payer: Self-pay | Admitting: Psychiatry

## 2019-03-09 DIAGNOSIS — G4700 Insomnia, unspecified: Secondary | ICD-10-CM

## 2019-03-29 ENCOUNTER — Other Ambulatory Visit: Payer: Self-pay | Admitting: Psychiatry

## 2019-03-29 ENCOUNTER — Other Ambulatory Visit: Payer: Self-pay | Admitting: Neurology

## 2019-03-29 DIAGNOSIS — G4700 Insomnia, unspecified: Secondary | ICD-10-CM

## 2019-03-30 ENCOUNTER — Other Ambulatory Visit: Payer: Self-pay | Admitting: Psychiatry

## 2019-03-30 NOTE — Telephone Encounter (Signed)
Has appt 08/20, looks like taking both for sleep  Refills okay?

## 2019-04-09 ENCOUNTER — Encounter: Payer: Self-pay | Admitting: Psychiatry

## 2019-04-09 ENCOUNTER — Other Ambulatory Visit: Payer: Self-pay

## 2019-04-09 ENCOUNTER — Ambulatory Visit (INDEPENDENT_AMBULATORY_CARE_PROVIDER_SITE_OTHER): Payer: BC Managed Care – PPO | Admitting: Psychiatry

## 2019-04-09 DIAGNOSIS — F411 Generalized anxiety disorder: Secondary | ICD-10-CM | POA: Diagnosis not present

## 2019-04-09 DIAGNOSIS — F329 Major depressive disorder, single episode, unspecified: Secondary | ICD-10-CM

## 2019-04-09 DIAGNOSIS — G4700 Insomnia, unspecified: Secondary | ICD-10-CM | POA: Diagnosis not present

## 2019-04-09 DIAGNOSIS — F32A Depression, unspecified: Secondary | ICD-10-CM

## 2019-04-09 MED ORDER — BELSOMRA 20 MG PO TABS: 20.0000 mg | ORAL_TABLET | Freq: Every day | ORAL | 2 refills | Status: DC

## 2019-04-09 MED ORDER — SERTRALINE HCL 100 MG PO TABS: 200.0000 mg | ORAL_TABLET | Freq: Every day | ORAL | 2 refills | Status: DC

## 2019-04-09 NOTE — Progress Notes (Signed)
[name] [id number] [birth year] 53 y.o.  Subjective:   Patient ID:  Joan West is a 53 y.o. (DOB [birth year]) female.  Chief Complaint:  Chief Complaint  Patient presents with  . Follow-up    h/o Insomnia, Anxiety, Depression    HPI Joan West presents to the office today for follow-up of anxiety, depression, and insomnia. She reports that she is falling asleep without difficulty and is occasionally awakening in the night and is able to return to sleep without significant difficulty. She reports occ she will take Seroquel 25 mg if she experiences middle of the night awakening, particularly if she still has several hours to sleep.   Reports that she had some anxiety initially when she accepted a new job and thinking about children being at home with virtual learning. Reports that she initially has some SOB She reports that she typically has anxiety with significant changes, such as job changes, and this can trigger some self-doubt. Denies any recent panic attacks. Reports that she had anxiety this summer when she had some abnormal results on cardiac studies and had a cardiac cath.  Denies depressed mood. Appetite has been stable. Energy and motivation have been stable. Denies impaired concentration. Denies SI.   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 Started school 2 weeks and will start classes next week. Working 7:45-3:45 pm. Husband is working from home. Children are taking classes virtually.   Past medication trials: Cymbalta-helpful for depression but not anxiety Sertraline-effective for mood and anxiety. Thinks it may cause intrusive Citalopram Seroquel Trazodone-ineffective Melatonin-ineffective Benadryl-ineffective for insomnia Ambien-parasomnias Trileptal-ineffective for insomnia or anxiety Gabapentin Lunesta  Review of Systems:  Review of Systems  Musculoskeletal: Negative for gait problem.  Neurological: Negative  for tremors.  Psychiatric/Behavioral:       Please refer to HPI    Medications: I have reviewed the patient's current medications.  Current Outpatient Medications  Medication Sig Dispense Refill  . aspirin EC 81 MG tablet Take 81 mg by mouth daily.    . Cholecalciferol (VITAMIN D PO) Take 1 tablet by mouth daily.    . Coenzyme Q10 (CO Q 10 PO) Take by mouth.    . Cyanocobalamin (B-12 PO) Take 1 tablet by mouth daily.    Marland Kitchen ELDERBERRY PO Take by mouth.    . Eszopiclone 3 MG TABS TAKE 1 TABLET (3 MG TOTAL) BY MOUTH AT BEDTIME FOR 30 DAYS. 30 tablet 3  . losartan (COZAAR) 50 MG tablet Take 50 mg by mouth 2 (two) times daily.     . Multiple Vitamin (MULTIVITAMIN) tablet Take 1 tablet by mouth daily. Centrum Silver daily    . Niacin (ENDUR-ACIN PO) Take 500 mg by mouth daily.    . Nutritional Supplements (JUICE PLUS FIBRE PO) Take 1 tablet by mouth daily.    . Omega-3 Fatty Acids (FISH OIL) 1000 MG CAPS Take by mouth.    . rosuvastatin (CRESTOR) 20 MG tablet TAKE ONE TABLET (20 MG DOSE) BY MOUTH DAILY.    Marland Kitchen sertraline (ZOLOFT) 100 MG tablet Take 2 tablets (200 mg total) by mouth daily. 180 tablet 2  . SUMAtriptan (IMITREX) 100 MG tablet TAKE 1 TABLET BY MOUTH ONCE AS NEEDED FOR MIGRAINE. MAY REPEAT IN 2 HRS IF HEADACHE PERSISTS OR RECURS 10 tablet 0  . [START ON 04/27/2019] Suvorexant (BELSOMRA) 20 MG TABS Take 20 mg by mouth at bedtime. 90 tablet 2  . TURMERIC PO Take by  mouth.    . gabapentin (NEURONTIN) 300 MG capsule TAKE 1 CAPSULE BY MOUTH EVERYDAY AT BEDTIME (Patient not taking: Reported on 04/09/2019) 90 capsule 0  . QUEtiapine (SEROQUEL) 100 MG tablet Take 100 mg by mouth at bedtime. Taking 50-100 mg po QHS    . QUEtiapine (SEROQUEL) 25 MG tablet TAKE 1 TO 2 TABLETS BY MOUTH EVERY DAY AT BEDTIME (Patient taking differently: Take 25 mg by mouth at bedtime as needed. ) 180 tablet 0   No current facility-administered medications for this visit.     Medication Side Effects:  None  Allergies:  Allergies  Allergen Reactions  . Sulfa Antibiotics Rash    fever    Past Medical History:  Diagnosis Date  . Anemia    has received iron infusions  . Anxiety   . Arthritis    mid foot arthristis  . Cancer ([place])   . Carpal tunnel syndrome   . Headache   . Hyperlipidemia   . Hypertension   . Insomnia   . Migraine   . Obstructive sleep apnea   . PONV (postoperative nausea and vomiting)   . Skin cancer   . Skin cancer, basal cell   . Sleep apnea    wears cpap  . Vision loss of left eye    history of lazy eye    Family History  Problem Relation Age of Onset  . Lung disease Mother   . Schizophrenia Mother   . Drug abuse Mother   . Heart disease Father   . Drug abuse Father   . Bipolar disorder Father   . Anxiety disorder Son   . Colon polyps Neg Hx   . Colon cancer Neg Hx   . Esophageal cancer Neg Hx   . Rectal cancer Neg Hx   . Stomach cancer Neg Hx     Social History   Socioeconomic History  . Marital status: Married    Spouse name: Not on file  . Number of children: Not on file  . Years of education: Not on file  . Highest education level: Not on file  Occupational History  . Not on file  Social Needs  . Financial resource strain: Not on file  . Food insecurity    Worry: Not on file    Inability: Not on file  . Transportation needs    Medical: Not on file    Non-medical: Not on file  Tobacco Use  . Smoking status: Never Smoker  . Smokeless tobacco: Never Used  Substance and Sexual Activity  . Alcohol use: Yes    Alcohol/week: 1.0 standard drinks    Types: 1 Glasses of wine per week    Comment: wine rarely  . Drug use: No  . Sexual activity: Not on file  Lifestyle  . Physical activity    Days per week: Not on file    Minutes per session: Not on file  . Stress: Not on file  Relationships  . Social Herbalist on phone: Not on file    Gets together: Not on file    Attends religious service: Not on file     Active member of club or organization: Not on file    Attends meetings of clubs or organizations: Not on file    Relationship status: Not on file  . Intimate partner violence    Fear of current or ex partner: Not on file    Emotionally abused: Not on file    Physically  abused: Not on file    Forced sexual activity: Not on file  Other Topics Concern  . Not on file  Social History Narrative  . Not on file    Past Medical History, Surgical history, Social history, and Family history were reviewed and updated as appropriate.   Please see review of systems for further details on the patient's review from today.   Objective:   Physical Exam:  Wt 231 lb (104.8 kg)   BMI 40.28 kg/m   Physical Exam Constitutional:      General: She is not in acute distress.    Appearance: She is well-developed.  Musculoskeletal:        General: No deformity.  Neurological:     Mental Status: She is alert and oriented to person, place, and time.     Coordination: Coordination normal.  Psychiatric:        Attention and Perception: Attention and perception normal. She does not perceive auditory or visual hallucinations.        Mood and Affect: Mood normal. Mood is not anxious or depressed. Affect is not labile, blunt, angry or inappropriate.        Speech: Speech normal.        Behavior: Behavior normal.        Thought Content: Thought content normal. Thought content does not include homicidal or suicidal ideation. Thought content does not include homicidal or suicidal plan.        Cognition and Memory: Cognition and memory normal.        Judgment: Judgment normal.     Comments: Insight intact. No delusions.      Lab Review:     Component Value Date/Time   NA 134 (L) 03/29/2016 1135   K 3.5 03/29/2016 1135   CL 104 03/29/2016 1135   CO2 25 03/29/2016 1135   GLUCOSE 85 03/29/2016 1135   BUN 16 03/29/2016 1135   CREATININE 0.87 03/29/2016 1135   CALCIUM 9.2 03/29/2016 1135   GFRNONAA >60  03/29/2016 1135   GFRAA >60 03/29/2016 1135       Component Value Date/Time   WBC 6.8 03/29/2016 1135   RBC 3.84 (L) 03/29/2016 1135   HGB 11.9 (L) 03/29/2016 1135   HCT 35.0 (L) 03/29/2016 1135   PLT 261 03/29/2016 1135   MCV 91.1 03/29/2016 1135   MCH 31.0 03/29/2016 1135   MCHC 34.0 03/29/2016 1135   RDW 13.3 03/29/2016 1135   LYMPHSABS 3.0 03/15/2014 0540   MONOABS 0.6 03/15/2014 0540   EOSABS 0.2 03/15/2014 0540   BASOSABS 0.0 03/15/2014 0540    No results found for: POCLITH, LITHIUM   No results found for: PHENYTOIN, PHENOBARB, VALPROATE, CBMZ   .res Assessment: Plan:   Will continue current plan of care.   Scottie was seen today for follow-up.  Diagnoses and all orders for this visit:  Generalized anxiety disorder -     sertraline (ZOLOFT) 100 MG tablet; Take 2 tablets (200 mg total) by mouth daily.  Depression, unspecified depression type -     sertraline (ZOLOFT) 100 MG tablet; Take 2 tablets (200 mg total) by mouth daily.  Insomnia, unspecified type -     Suvorexant (BELSOMRA) 20 MG TABS; Take 20 mg by mouth at bedtime.     Please see After Visit Summary for patient specific instructions.  Future Appointments  Date Time Provider [place]  11/24/2019 11:00 AM Thayer Headings, PMHNP CP-CP None    No orders of the defined types  were placed in this encounter.   -------------------------------

## 2019-07-21 ENCOUNTER — Other Ambulatory Visit: Payer: Self-pay | Admitting: Psychiatry

## 2019-07-21 DIAGNOSIS — G4700 Insomnia, unspecified: Secondary | ICD-10-CM

## 2019-07-22 NOTE — Telephone Encounter (Signed)
Due back in April 

## 2019-08-21 ENCOUNTER — Other Ambulatory Visit: Payer: Self-pay | Admitting: Neurology

## 2019-10-18 ENCOUNTER — Ambulatory Visit: Payer: Self-pay | Attending: Internal Medicine

## 2019-10-18 DIAGNOSIS — Z23 Encounter for immunization: Secondary | ICD-10-CM | POA: Insufficient documentation

## 2019-10-18 NOTE — Progress Notes (Signed)
   Covid-19 Vaccination Clinic  Name:  Joan West    MRN: [id number] DOB: 11/19/1965  10/18/2019  Ms. Auriemma was observed post Covid-19 immunization for 15 minutes without incidence. She was provided with Vaccine Information Sheet and instruction to access the V-Safe system.   Ms. Furtak was instructed to call 911 with any severe reactions post vaccine: Marland Kitchen Difficulty breathing  . Swelling of your face and throat  . A fast heartbeat  . A bad rash all over your body  . Dizziness and weakness    Immunizations Administered    Name Date Dose VIS Date Route   Pfizer COVID-19 Vaccine 10/18/2019  1:28 PM 0.3 mL 07/31/2019 Intramuscular   Manufacturer: [place]   Lot: [id number]   [place]: [id number]

## 2019-11-10 ENCOUNTER — Ambulatory Visit: Payer: Self-pay | Attending: Internal Medicine

## 2019-11-10 DIAGNOSIS — Z23 Encounter for immunization: Secondary | ICD-10-CM

## 2019-11-10 NOTE — Progress Notes (Signed)
   Covid-19 Vaccination Clinic  Name:  SAMALA LAFAVOR    MRN: [id number] DOB: 1966-06-08  11/10/2019  Ms. Malan was observed post Covid-19 immunization for 15 minutes without incident. She was provided with Vaccine Information Sheet and instruction to access the V-Safe system.   Ms. Brodman was instructed to call 911 with any severe reactions post vaccine: Marland Kitchen Difficulty breathing  . Swelling of face and throat  . A fast heartbeat  . A bad rash all over body  . Dizziness and weakness   Immunizations Administered    Name Date Dose VIS Date Route   Pfizer COVID-19 Vaccine 11/10/2019  9:35 AM 0.3 mL 07/31/2019 Intramuscular   Manufacturer: [place]   Lot: [id number]   [place]: [id number]

## 2019-11-13 ENCOUNTER — Other Ambulatory Visit: Payer: Self-pay | Admitting: Psychiatry

## 2019-11-13 DIAGNOSIS — G4700 Insomnia, unspecified: Secondary | ICD-10-CM

## 2019-11-15 NOTE — Telephone Encounter (Signed)
Apt 04/06

## 2019-11-24 ENCOUNTER — Encounter: Payer: Self-pay | Admitting: Psychiatry

## 2019-11-24 ENCOUNTER — Other Ambulatory Visit: Payer: Self-pay

## 2019-11-24 ENCOUNTER — Ambulatory Visit (INDEPENDENT_AMBULATORY_CARE_PROVIDER_SITE_OTHER): Payer: BC Managed Care – PPO | Admitting: Psychiatry

## 2019-11-24 VITALS — BP 165/107 | HR 71 | Wt 236.0 lb

## 2019-11-24 DIAGNOSIS — F329 Major depressive disorder, single episode, unspecified: Secondary | ICD-10-CM | POA: Diagnosis not present

## 2019-11-24 DIAGNOSIS — F5101 Primary insomnia: Secondary | ICD-10-CM

## 2019-11-24 DIAGNOSIS — F411 Generalized anxiety disorder: Secondary | ICD-10-CM

## 2019-11-24 DIAGNOSIS — F32A Depression, unspecified: Secondary | ICD-10-CM

## 2019-11-24 MED ORDER — QUETIAPINE FUMARATE 25 MG PO TABS: ORAL_TABLET | ORAL | 1 refills | Status: DC

## 2019-11-24 MED ORDER — SERTRALINE HCL 100 MG PO TABS: 200.0000 mg | ORAL_TABLET | Freq: Every day | ORAL | 2 refills | Status: DC

## 2019-11-24 MED ORDER — QUETIAPINE FUMARATE 50 MG PO TABS: 50.0000 mg | ORAL_TABLET | Freq: Every day | ORAL | 1 refills | Status: DC

## 2019-11-24 NOTE — Progress Notes (Signed)
   11/24/19 1136  Facial and Oral Movements  Muscles of Facial Expression 0  Lips and Perioral Area 0  Jaw 0  Tongue 0  Extremity Movements  Upper (arms, wrists, hands, fingers) 0  Lower (legs, knees, ankles, toes) 0  Trunk Movements  Neck, shoulders, hips 0  Overall Severity  Severity of abnormal movements (highest score from questions above) 0  Incapacitation due to abnormal movements 0  Patient's awareness of abnormal movements (rate only patient's report) 0  AIMS Total Score  AIMS Total Score 0

## 2019-11-24 NOTE — Progress Notes (Signed)
Joan West [id number] 1965/10/29 54 y.o.  Subjective:   Patient ID:  Joan West is a 54 y.o. (DOB 1965/10/09) female.  Chief Complaint:  Chief Complaint  Patient presents with  . Follow-up    Anxiety, Insomnia, Depression    HPI Joan West presents to the office today for follow-up of anxiety, depression, and insomnia. She reports occ changes in mood and questions if this is related to menopause. She reports that she was recently more irritated by things than she normally would be. She reports a few weeks ago she had an overwhelming feeling that something bad was going to happen. Reports that she did not have panic s/s at the time. Sometimes feels as if her heart is racing and is not sure if it is related to caffeine or other factors. She reports that her fit bit will indicate HR is in the 70's when she feels as if she is racing. Reports that she has some worry about her son who is being tx'd for depression and possibly on autism spectrum. Worries some about other family members. Denies depressed mood. She reports that sleep has been ok. Falling asleep without difficulty most nights. Occ awakening during the night and then takes Seroquel 50 mg prn. She reports that she is sleeping an adequate amount and that sleep quality has been good. Energy and motivation have been ok. Concentration has been good. Denies SI.   She reports that she has gained some weight during the pandemic. She has noticed that her activity level has varied during the pandemic.   She is currently on spring break. Has been enjoying new job and reports that it is less stress. She reports that commute has been manageable. Family has noticed that her work stress has been significantly less.    AIMS     Office Visit from 11/24/2019 in [facility]  AIMS Total Score  0    Past medication trials: Cymbalta-helpful for depression but not anxiety Sertraline-effective for mood and anxiety. Thinks it  may cause intrusive Citalopram Seroquel Trazodone-ineffective Melatonin-ineffective Benadryl-ineffective for insomnia Ambien-parasomnias Belsomra- unsure of benefit.  Trileptal-ineffective for insomnia or anxiety Gabapentin Lunesta   Review of Systems:  Review of Systems  Musculoskeletal: Negative for gait problem.  Neurological: Negative for tremors.  Psychiatric/Behavioral:       Please refer to HPI  Rare RLS  Medications: I have reviewed the patient's current medications.  Current Outpatient Medications  Medication Sig Dispense Refill  . aspirin EC 81 MG tablet Take 81 mg by mouth. Takes occ    . Cholecalciferol (VITAMIN D PO) Take 1 tablet by mouth daily.    . Coenzyme Q10 (CO Q 10 PO) Take by mouth.    . Cyanocobalamin (B-12 PO) Take 1 tablet by mouth daily.    Marland Kitchen ELDERBERRY PO Take by mouth.    . Eszopiclone 3 MG TABS TAKE 1 TABLET (3 MG TOTAL) BY MOUTH AT BEDTIME FOR 30 DAYS. 30 tablet 3  . losartan (COZAAR) 50 MG tablet Take 50 mg by mouth 2 (two) times daily.     . Multiple Vitamin (MULTIVITAMIN) tablet Take 1 tablet by mouth daily. Centrum Silver daily    . Niacin (ENDUR-ACIN PO) Take 500 mg by mouth daily.    . Nutritional Supplements (JUICE PLUS FIBRE PO) Take 1 tablet by mouth daily.    . Omega-3 Fatty Acids (FISH OIL) 1000 MG CAPS Take by mouth.    . rosuvastatin (CRESTOR) 20 MG tablet TAKE  ONE TABLET (20 MG DOSE) BY MOUTH DAILY.    . SUMAtriptan (IMITREX) 100 MG tablet TAKE 1 TABLET BY MOUTH ONCE AS NEEDED FOR MIGRAINE. MAY REPEAT IN 2 HRS IF HEADACHE PERSISTS OR RECURS 10 tablet 0  . TURMERIC PO Take by mouth.    . gabapentin (NEURONTIN) 300 MG capsule TAKE 1 CAPSULE BY MOUTH EVERYDAY AT BEDTIME (Patient not taking: Reported on 04/09/2019) 90 capsule 0  . QUEtiapine (SEROQUEL) 25 MG tablet Take 1-2 tabs po prn middle of the night awakening 180 tablet 1  . QUEtiapine (SEROQUEL) 50 MG tablet Take 1 tablet (50 mg total) by mouth at bedtime. Taking 50-100 mg po QHS  90 tablet 1  . sertraline (ZOLOFT) 100 MG tablet Take 2 tablets (200 mg total) by mouth daily. 180 tablet 2   No current facility-administered medications for this visit.    Medication Side Effects: None  Allergies:  Allergies  Allergen Reactions  . Sulfa Antibiotics Rash    fever    Past Medical History:  Diagnosis Date  . Anemia    has received iron infusions  . Anxiety   . Arthritis    mid foot arthristis  . Cancer ([place])   . Carpal tunnel syndrome   . Headache   . Hyperlipidemia   . Hypertension   . Insomnia   . Migraine   . Obstructive sleep apnea   . PONV (postoperative nausea and vomiting)   . Skin cancer   . Skin cancer, basal cell   . Sleep apnea    wears cpap  . Vision loss of left eye    history of lazy eye    Family History  Problem Relation Age of Onset  . Lung disease Mother   . Schizophrenia Mother   . Drug abuse Mother   . Heart disease Father   . Drug abuse Father   . Bipolar disorder Father   . Anxiety disorder Son   . Colon polyps Neg Hx   . Colon cancer Neg Hx   . Esophageal cancer Neg Hx   . Rectal cancer Neg Hx   . Stomach cancer Neg Hx     Social History   Socioeconomic History  . Marital status: Married    Spouse name: Not on file  . Number of children: Not on file  . Years of education: Not on file  . Highest education level: Not on file  Occupational History  . Not on file  Tobacco Use  . Smoking status: Never Smoker  . Smokeless tobacco: Never Used  Substance and Sexual Activity  . Alcohol use: Yes    Alcohol/week: 1.0 standard drinks    Types: 1 Glasses of wine per week    Comment: wine rarely  . Drug use: No  . Sexual activity: Not on file  Other Topics Concern  . Not on file  Social History Narrative  . Not on file   Social Determinants of Health   Financial Resource Strain:   . Difficulty of Paying Living Expenses:   Food Insecurity:   . Worried About Charity fundraiser in the Last Year:   . Youth worker in the Last Year:   Transportation Needs:   . Film/video editor (Medical):   Marland Kitchen Lack of Transportation (Non-Medical):   Physical Activity:   . Days of Exercise per Week:   . Minutes of Exercise per Session:   Stress:   . Feeling of Stress :   Social  Connections:   . Frequency of Communication with Friends and Family:   . Frequency of Social Gatherings with Friends and Family:   . Attends Religious Services:   . Active Member of Clubs or Organizations:   . Attends Archivist Meetings:   Marland Kitchen Marital Status:   Intimate Partner Violence:   . Fear of Current or Ex-Partner:   . Emotionally Abused:   Marland Kitchen Physically Abused:   . Sexually Abused:     Past Medical History, Surgical history, Social history, and Family history were reviewed and updated as appropriate.   Please see review of systems for further details on the patient's review from today.   Objective:   Physical Exam:  BP (!) 165/107   Pulse 71   Wt 236 lb (107 kg)   BMI 41.15 kg/m   Physical Exam Constitutional:      General: She is not in acute distress. Musculoskeletal:        General: No deformity.  Neurological:     Mental Status: She is alert and oriented to person, place, and time.     Coordination: Coordination normal.  Psychiatric:        Attention and Perception: Attention and perception normal. She does not perceive auditory or visual hallucinations.        Mood and Affect: Mood normal. Mood is not anxious or depressed. Affect is not labile, blunt, angry or inappropriate.        Speech: Speech normal.        Behavior: Behavior normal.        Thought Content: Thought content normal. Thought content is not paranoid or delusional. Thought content does not include homicidal or suicidal ideation. Thought content does not include homicidal or suicidal plan.        Cognition and Memory: Cognition and memory normal.        Judgment: Judgment normal.     Comments: Insight intact     Lab  Review:     Component Value Date/Time   NA 134 (L) 03/29/2016 1135   K 3.5 03/29/2016 1135   CL 104 03/29/2016 1135   CO2 25 03/29/2016 1135   GLUCOSE 85 03/29/2016 1135   BUN 16 03/29/2016 1135   CREATININE 0.87 03/29/2016 1135   CALCIUM 9.2 03/29/2016 1135   GFRNONAA >60 03/29/2016 1135   GFRAA >60 03/29/2016 1135       Component Value Date/Time   WBC 6.8 03/29/2016 1135   RBC 3.84 (L) 03/29/2016 1135   HGB 11.9 (L) 03/29/2016 1135   HCT 35.0 (L) 03/29/2016 1135   PLT 261 03/29/2016 1135   MCV 91.1 03/29/2016 1135   MCH 31.0 03/29/2016 1135   MCHC 34.0 03/29/2016 1135   RDW 13.3 03/29/2016 1135   LYMPHSABS 3.0 03/15/2014 0540   MONOABS 0.6 03/15/2014 0540   EOSABS 0.2 03/15/2014 0540   BASOSABS 0.0 03/15/2014 0540    No results found for: POCLITH, LITHIUM   No results found for: PHENYTOIN, PHENOBARB, VALPROATE, CBMZ   .res Assessment: Plan:   Will continue current plan of care since target signs and symptoms are well controlled without any tolerability issues. Will change Seroquel tabs to 50 mg po QHS since pt reports that she has consistently been taking 1/2 of a 10 mg tab at HS. Will send separate script for Seroquel 25 mg 1-2 tabs po QHS prn middle of the night awakenings. Continue Sertraline 200 mg po qd for mood and anxiety.  Pt to f/u in  4-6 months or sooner if clinically indicated.  Patient advised to contact office with any questions, adverse effects, or acute worsening in signs and symptoms.  Joan West was seen today for follow-up.  Diagnoses and all orders for this visit:  Primary insomnia -     QUEtiapine (SEROQUEL) 50 MG tablet; Take 1 tablet (50 mg total) by mouth at bedtime. Taking 50-100 mg po QHS -     QUEtiapine (SEROQUEL) 25 MG tablet; Take 1-2 tabs po prn middle of the night awakening  Generalized anxiety disorder -     sertraline (ZOLOFT) 100 MG tablet; Take 2 tablets (200 mg total) by mouth daily.  Depression, unspecified depression type -      sertraline (ZOLOFT) 100 MG tablet; Take 2 tablets (200 mg total) by mouth daily.     Please see After Visit Summary for patient specific instructions.  Future Appointments  Date Time Provider [place]  03/25/2020 10:00 AM Thayer Headings, PMHNP CP-CP None    No orders of the defined types were placed in this encounter.   -------------------------------

## 2019-12-02 ENCOUNTER — Encounter (HOSPITAL_BASED_OUTPATIENT_CLINIC_OR_DEPARTMENT_OTHER): Payer: Self-pay

## 2019-12-02 ENCOUNTER — Other Ambulatory Visit: Payer: Self-pay

## 2019-12-02 ENCOUNTER — Emergency Department (HOSPITAL_BASED_OUTPATIENT_CLINIC_OR_DEPARTMENT_OTHER): Payer: BC Managed Care – PPO

## 2019-12-02 ENCOUNTER — Emergency Department (HOSPITAL_BASED_OUTPATIENT_CLINIC_OR_DEPARTMENT_OTHER)
Admission: EM | Admit: 2019-12-02 | Discharge: 2019-12-02 | Disposition: A | Discharge status: Home / Self Care | Payer: BC Managed Care – PPO | Attending: Emergency Medicine | Admitting: Emergency Medicine

## 2019-12-02 ENCOUNTER — Telehealth: Payer: Self-pay | Admitting: Neurology

## 2019-12-02 DIAGNOSIS — Z85828 Personal history of other malignant neoplasm of skin: Secondary | ICD-10-CM | POA: Diagnosis not present

## 2019-12-02 DIAGNOSIS — G43809 Other migraine, not intractable, without status migrainosus: Secondary | ICD-10-CM

## 2019-12-02 DIAGNOSIS — Z9884 Bariatric surgery status: Secondary | ICD-10-CM | POA: Diagnosis not present

## 2019-12-02 DIAGNOSIS — R519 Headache, unspecified: Secondary | ICD-10-CM | POA: Diagnosis present

## 2019-12-02 DIAGNOSIS — Z7982 Long term (current) use of aspirin: Secondary | ICD-10-CM | POA: Insufficient documentation

## 2019-12-02 DIAGNOSIS — I10 Essential (primary) hypertension: Secondary | ICD-10-CM | POA: Diagnosis not present

## 2019-12-02 DIAGNOSIS — Z79899 Other long term (current) drug therapy: Secondary | ICD-10-CM | POA: Insufficient documentation

## 2019-12-02 MED ORDER — SODIUM CHLORIDE 0.9 % IV BOLUS
1000.0000 mL | Freq: Once | INTRAVENOUS | Status: AC
  Administered 2019-12-02: 1000 mL via INTRAVENOUS

## 2019-12-02 MED ORDER — DEXAMETHASONE SODIUM PHOSPHATE 10 MG/ML IJ SOLN
10.0000 mg | Freq: Once | INTRAMUSCULAR | Status: AC
  Administered 2019-12-02: 10 mg via INTRAVENOUS
  Filled 2019-12-02: qty 1

## 2019-12-02 MED ORDER — DIPHENHYDRAMINE HCL 50 MG/ML IJ SOLN
25.0000 mg | Freq: Once | INTRAMUSCULAR | Status: AC
  Administered 2019-12-02: 25 mg via INTRAVENOUS
  Filled 2019-12-02: qty 1

## 2019-12-02 MED ORDER — DROPERIDOL 2.5 MG/ML IJ SOLN
1.2500 mg | Freq: Once | INTRAMUSCULAR | Status: AC
  Administered 2019-12-02: 1.25 mg via INTRAVENOUS
  Filled 2019-12-02: qty 2

## 2019-12-02 MED ORDER — MAGNESIUM SULFATE 2 GM/50ML IV SOLN
2.0000 g | Freq: Once | INTRAVENOUS | Status: AC
  Administered 2019-12-02: 2 g via INTRAVENOUS
  Filled 2019-12-02: qty 50

## 2019-12-02 MED ORDER — PROCHLORPERAZINE EDISYLATE 10 MG/2ML IJ SOLN
10.0000 mg | Freq: Once | INTRAMUSCULAR | Status: AC
  Administered 2019-12-02: 10 mg via INTRAVENOUS
  Filled 2019-12-02: qty 2

## 2019-12-02 NOTE — Telephone Encounter (Signed)
Appt no longer available tomorrow if she calls back

## 2019-12-02 NOTE — ED Triage Notes (Signed)
Pt c/o migraine started 2 days ago-seen at [facility] today with no relief-NAD-steady gait

## 2019-12-02 NOTE — Telephone Encounter (Signed)
Spoke with Joan West to let her know pt last seen 2019. Needs appt. Joan West states there is no soon appt and pt was requesting to be seen today. Dr. Felecia Shelling does not have any appt today, I will speak with MD on suggestions.

## 2019-12-02 NOTE — ED Provider Notes (Signed)
[facility] EMERGENCY DEPARTMENT Provider Note   CSN: [id number] Arrival date & time: 12/02/19  1828     History Chief Complaint  Patient presents with  . Migraine    HPI   Blood pressure (!) 196/103, pulse 88, temperature 97.9 F (36.6 C), temperature source Oral, resp. rate 16, height 5' 3.5" (1.613 m), weight 108.3 kg, SpO2 98 %.  Joan West is a 54 y.o. female complaining of migraine exacerbation onset 2 days ago despite taking her triptan's which normally alleviate her headache.  She was seen at urgent care and given IM Toradol and IM Depo-Medrol with little relief.  She is sensitive to light not sensitive to sound, she is followed by neurology for her headaches this is her typical headache but it normally does not last this year long.  She has withheld her triptan pain medication because she feels that it elevates her blood pressure today.      Past Medical History:  Diagnosis Date  . Anemia    has received iron infusions  . Anxiety   . Arthritis    mid foot arthristis  . Cancer ([place])   . Carpal tunnel syndrome   . Headache   . Hyperlipidemia   . Hypertension   . Insomnia   . Migraine   . Obstructive sleep apnea   . PONV (postoperative nausea and vomiting)   . Skin cancer   . Skin cancer, basal cell   . Sleep apnea    wears cpap  . Vision loss of left eye    history of lazy eye    Patient Active Problem List   Diagnosis Date Noted  . Common migraine without intractability 01/11/2016  . OSA on CPAP 01/11/2016  . Insomnia 01/11/2016  . Restless leg syndrome 01/11/2016  . Anemia, iron deficiency 02/17/2015  . Bariatric surgery status 02/17/2015  . Acute infection of nasal sinus 04/20/2014  . Anxiety, generalized 04/19/2014    Past Surgical History:  Procedure Laterality Date  . APPENDECTOMY    . BILATERAL CARPAL TUNNEL RELEASE    . CARDIAC CATHETERIZATION    . CESAREAN SECTION     x2  . DILITATION & CURRETTAGE/HYSTROSCOPY WITH  NOVASURE ABLATION N/A 04/03/2016   Procedure: DILATATION & CURETTAGE/HYSTEROSCOPY WITH NOVASURE ABLATION;  Surgeon: Brien Few, MD;  Location: [facility];  Service: Gynecology;  Laterality: N/A;  . GASTRIC BYPASS    . skin cancer    . SKIN CANCER EXCISION       OB History   No obstetric history on file.     Family History  Problem Relation Age of Onset  . Lung disease Mother   . Schizophrenia Mother   . Drug abuse Mother   . Heart disease Father   . Drug abuse Father   . Bipolar disorder Father   . Anxiety disorder Son   . Colon polyps Neg Hx   . Colon cancer Neg Hx   . Esophageal cancer Neg Hx   . Rectal cancer Neg Hx   . Stomach cancer Neg Hx     Social History   Tobacco Use  . Smoking status: Never Smoker  . Smokeless tobacco: Never Used  Substance Use Topics  . Alcohol use: Yes    Alcohol/week: 1.0 standard drinks    Types: 1 Glasses of wine per week    Comment: wine rarely  . Drug use: No    Home Medications Prior to Admission medications   Medication Sig Start Date End  Date Taking? Authorizing Provider  aspirin EC 81 MG tablet Take 81 mg by mouth. Takes occ    [provider]  Cholecalciferol (VITAMIN D PO) Take 1 tablet by mouth daily.    [provider]  Coenzyme Q10 (CO Q 10 PO) Take by mouth.    [provider]  Cyanocobalamin (B-12 PO) Take 1 tablet by mouth daily.    [provider]  ELDERBERRY PO Take by mouth.    [provider]  Eszopiclone 3 MG TABS TAKE 1 TABLET (3 MG TOTAL) BY MOUTH AT BEDTIME FOR 30 DAYS. 11/16/19 12/16/19  Thayer Headings, PMHNP  gabapentin (NEURONTIN) 300 MG capsule TAKE 1 CAPSULE BY MOUTH EVERYDAY AT BEDTIME Patient not taking: Reported on 04/09/2019 03/09/19   Thayer Headings, PMHNP  losartan (COZAAR) 50 MG tablet Take 50 mg by mouth 2 (two) times daily.     [provider]  Multiple Vitamin (MULTIVITAMIN) tablet Take 1 tablet by mouth daily. Centrum Silver daily    [provider]  Niacin (ENDUR-ACIN PO) Take 500 mg by mouth daily.    [provider]  Nutritional Supplements (JUICE PLUS FIBRE PO) Take 1 tablet by mouth daily.    [provider]  Omega-3 Fatty Acids (FISH OIL) 1000 MG CAPS Take by mouth.    [provider]  QUEtiapine (SEROQUEL) 25 MG tablet Take 1-2 tabs po prn middle of the night awakening 11/24/19   Thayer Headings, PMHNP  QUEtiapine (SEROQUEL) 50 MG tablet Take 1 tablet (50 mg total) by mouth at bedtime. Taking 50-100 mg po QHS 11/24/19   Thayer Headings, PMHNP  rosuvastatin (CRESTOR) 20 MG tablet TAKE ONE TABLET (20 MG DOSE) BY MOUTH DAILY. 01/13/19   [provider]  sertraline (ZOLOFT) 100 MG tablet Take 2 tablets (200 mg total) by mouth daily. 11/24/19   Thayer Headings, PMHNP  SUMAtriptan (IMITREX) 100 MG tablet TAKE 1 TABLET BY MOUTH ONCE AS NEEDED FOR MIGRAINE. MAY REPEAT IN 2 HRS IF HEADACHE PERSISTS OR RECURS 03/30/19   Sater, Nanine Means, MD  TURMERIC PO Take by mouth.    [provider]    Allergies    Sulfa antibiotics  Review of Systems   Review of Systems   A complete review of systems was obtained and all systems are negative except as noted in the HPI and PMH.    Physical Exam Updated Vital Signs BP (!) 134/91   Pulse 72   Temp 97.9 F (36.6 C) (Oral)   Resp 16   Ht 5' 3.5" (1.613 m)   Wt 108.3 kg   SpO2 95%   BMI 41.64 kg/m   Physical Exam Vitals and nursing note reviewed.  Constitutional:      Appearance: She is well-developed.  HENT:     Head: Normocephalic and atraumatic.  Eyes:     Conjunctiva/sclera: Conjunctivae normal.     Pupils: Pupils are equal, round, and reactive to light.     Comments: No TTP of maxillary or frontal sinuses  No TTP or induration of temporal arteries bilaterally  Neck:     Comments: FROM to C-spine. Pt can touch chin to chest without discomfort. No TTP of midline cervical spine.  Cardiovascular:     Rate and Rhythm: Normal rate  and regular rhythm.  Pulmonary:     Effort: Pulmonary effort is normal. No respiratory distress.     Breath sounds: Normal breath sounds. No wheezing or rales.  Chest:     Chest  wall: No tenderness.  Abdominal:     General: Bowel sounds are normal.     Palpations: Abdomen is soft.     Tenderness: There is no abdominal tenderness.  Musculoskeletal:        General: No tenderness. Normal range of motion.     Cervical back: Normal range of motion and neck supple.  Neurological:     Mental Status: She is alert and oriented to person, place, and time.     Cranial Nerves: No cranial nerve deficit.     Comments: II-Visual fields grossly intact. III/IV/VI-Extraocular movements intact.  Pupils reactive bilaterally. V/VII-Smile symmetric, equal eyebrow raise,  facial sensation intact VIII- Hearing grossly intact IX/X-Normal gag XI-bilateral shoulder shrug XII-midline tongue extension Motor: 5/5 bilaterally with normal tone and bulk Cerebellar: Normal finger-to-nose  and normal heel-to-shin test.   Romberg negative Ambulates with a coordinated gait      ED Results / Procedures / Treatments   Labs (all labs ordered are listed, but only abnormal results are displayed) Labs Reviewed - No data to display  EKG None  Radiology CT Head Wo Contrast  Result Date: 12/02/2019 CLINICAL DATA:  54 year old female with acute headache starting 2 days ago. EXAM: CT HEAD WITHOUT CONTRAST TECHNIQUE: Contiguous axial images were obtained from the base of the skull through the vertex without intravenous contrast. COMPARISON:  None. FINDINGS: Brain: Normal cerebral volume. No midline shift, ventriculomegaly, mass effect, evidence of mass lesion, intracranial hemorrhage or evidence of cortically based acute infarction. Gray-white matter differentiation is within normal limits throughout the brain. Vascular: No suspicious intracranial vascular hyperdensity. Skull: Negative. Sinuses/Orbits: Visualized paranasal  sinuses and mastoids are clear. Other: Visualized orbits and scalp soft tissues are within normal limits. IMPRESSION: Normal noncontrast head CT. Electronically Signed   By: Genevie Ann M.D.   On: 12/02/2019 19:45    Procedures Procedures (including critical care time)  Medications Ordered in ED Medications  prochlorperazine (COMPAZINE) injection 10 mg (10 mg Intravenous Given 12/02/19 1948)  magnesium sulfate IVPB 2 g 50 mL (0 g Intravenous Stopped 12/02/19 2050)  dexamethasone (DECADRON) injection 10 mg (10 mg Intravenous Given 12/02/19 1949)  sodium chloride 0.9 % bolus 1,000 mL (0 mLs Intravenous Stopped 12/02/19 2050)  droperidol (INAPSINE) 2.5 MG/ML injection 1.25 mg (1.25 mg Intravenous Given 12/02/19 2039)  diphenhydrAMINE (BENADRYL) injection 25 mg (25 mg Intravenous Given 12/02/19 2038)    ED Course  I have reviewed the triage vital signs and the nursing notes.  Pertinent labs & imaging results that were available during my care of the patient were reviewed by me and considered in my medical decision making (see chart for details).    MDM Rules/Calculators/A&P                      Vitals:   12/02/19 1839 12/02/19 1840 12/02/19 1956 12/02/19 1956  BP: (!) 196/103  (!) 134/91   Pulse: 88  72   Resp: 16     Temp: 97.9 F (36.6 C)   97.9 F (36.6 C)  TempSrc: Oral   Oral  SpO2: 98%  95%   Weight:  108.3 kg    Height:  5' 3.5" (1.613 m)      Medications  prochlorperazine (COMPAZINE) injection 10 mg (10 mg Intravenous Given 12/02/19 1948)  magnesium sulfate IVPB 2 g 50 mL (0 g Intravenous Stopped 12/02/19 2050)  dexamethasone (DECADRON) injection 10 mg (10 mg Intravenous Given 12/02/19 1949)  sodium chloride 0.9 % bolus  1,000 mL (0 mLs Intravenous Stopped 12/02/19 2050)  droperidol (INAPSINE) 2.5 MG/ML injection 1.25 mg (1.25 mg Intravenous Given 12/02/19 2039)  diphenhydrAMINE (BENADRYL) injection 25 mg (25 mg Intravenous Given 12/02/19 2038)    Joan West is 54 y.o. female  presenting with migraine exacerbation.  Patient has a reassuring neurologic exam, afebrile, nontoxic-appearing, no meningeal signs.  Because her headache has lasted longer than prior headaches will obtain CT.  Patient evaluated after she is received to the Compazine and magnesium is infusing.  States that the headache may be improving slightly, she states that she has panic attacks she is feeling very anxious and panicky right now.  She normally takes Zoloft for her anxiety/panic.  We will give her a small dose of droperidol in addition to Benadryl  Evaluation does not show pathology that would require ongoing emergent intervention or inpatient treatment. Pt is hemodynamically stable and mentating appropriately. Discussed findings and plan with patient/guardian, who agrees with care plan. All questions answered. Return precautions discussed and outpatient follow up given.     Final Clinical Impression(s) / ED Diagnoses Final diagnoses:  Other migraine without status migrainosus, not intractable    Rx / DC Orders ED Discharge Orders    None       Dajia Gunnels, Charna Elizabeth 12/02/19 [place], [place], DO 12/03/19 [id number]

## 2019-12-02 NOTE — Telephone Encounter (Signed)
Called and LVM for pt offering appt tomorrow at 3pm with Dr. Felecia Shelling. If migraine severe enough and cannot wait until tomorrow, I recommended she contact PCP about getting in today for appt with them or she should proceed to urgent care. Advised she would need to be seen first before Dr. Felecia Shelling could prescribe anything since her last visit was in 2019.

## 2019-12-02 NOTE — Telephone Encounter (Signed)
Pt called stating she is on her third day of a bad migraine and has had to miss two days of work already. She would like to speak to RN to see what she can do to alleviate the pain. Please advise.

## 2019-12-14 ENCOUNTER — Telehealth: Payer: Self-pay | Admitting: Psychiatry

## 2019-12-14 NOTE — Telephone Encounter (Signed)
error 

## 2020-01-28 ENCOUNTER — Ambulatory Visit (INDEPENDENT_AMBULATORY_CARE_PROVIDER_SITE_OTHER): Payer: BC Managed Care – PPO | Admitting: Neurology

## 2020-01-28 ENCOUNTER — Encounter: Payer: Self-pay | Admitting: Neurology

## 2020-01-28 VITALS — BP 141/93 | HR 77 | Ht 63.5 in | Wt 241.0 lb

## 2020-01-28 DIAGNOSIS — G43009 Migraine without aura, not intractable, without status migrainosus: Secondary | ICD-10-CM | POA: Diagnosis not present

## 2020-01-28 DIAGNOSIS — G4700 Insomnia, unspecified: Secondary | ICD-10-CM | POA: Diagnosis not present

## 2020-01-28 DIAGNOSIS — G2581 Restless legs syndrome: Secondary | ICD-10-CM

## 2020-01-28 DIAGNOSIS — G4733 Obstructive sleep apnea (adult) (pediatric): Secondary | ICD-10-CM

## 2020-01-28 DIAGNOSIS — Z9989 Dependence on other enabling machines and devices: Secondary | ICD-10-CM

## 2020-01-28 MED ORDER — RIZATRIPTAN BENZOATE 10 MG PO TBDP: 10.0000 mg | ORAL_TABLET | ORAL | 11 refills | Status: DC | PRN

## 2020-01-28 NOTE — Progress Notes (Signed)
[facility]  PATIENT: Joan West DOB: 06-May-1966  REFERRING DOCTOR OR PCP:  Vedia Coffer SOURCE: Patient, from [facility], sleep study reports, sleep study data, download data  _________________________________   HISTORICAL  CHIEF COMPLAINT:  Chief Complaint  Patient presents with  . Follow-up    RM 12, alone. Last seen 04/02/2018.   Marland Kitchen Sleep Apnea    On CPAP. DME; [facility]. Able to download report from [facility] website.  . Migraine    takes imitrex prn.    HISTORY OF PRESENT ILLNESS:  Joan West is a 54 y.o. woman with migraines, sleep apnea, insomnia and restless leg syndrome    Update 01/28/2020: She has OSA diagnosed in 2000.   Her download showed 100% compliance with good efficacy (AHI = 4.7).    She is on CPAP +10 cm.   She tolerates it well.  Her CPAP machine is old.   Weight is 20 pounds more now than in 2017 when last study was performed.   She has no EDS on CPAP.     She has migraine headaches.   She averages 3 / month (range 0 to 6) but had several days in a row last month and she went to the ED.    This is unusual.    Imitrex has been associated with hypertension to times for her.     She never takes more than 2 a day and usually just 1/2.     On Lunesta plus Seroquel, insomnia is better.   She is on gabapentin for RLS.    She has more RLS a few weeks a year and is fine most of the year.    Update 04/02/2018: She feels her obstructive sleep apnea is doing well and she is compliant with the CPAP using it nightly.  She has some excessive daytime sleepiness if she sleeps poorly.  She has a long history of insomnia, often because she has difficulty quieting her mind at night.  She also has some restless leg syndrome.  She generally has done well at night on the combination of Seroquel, Zoloft, Lunesta and gabapentin 300 mg.   These medications that also helped her anxiety.  She sees behavioral health for  anxiety.  Anxiety is a little worse with her brother needing major cardiac surgery.    She has a long history of migraine headaches.  These are generally doing well and are occurring a couple  times a month on average..  Migraines are associated with pounding pain and nausea.  She does not get vomiting.  She will usually have photophobia and phonophobia.  Takes a worse when she moves.  When she gets a migraine she will take Imitrex with benefit.  From 03/13/2016: OSA: Ena was diagnosed with OSA in 2000 and started on CPAP. A PSG in 2005 showed that she had OSA with AHI equals 26 and CPAP pressure was increased from +10 cm to a pressure to +10 cm. Of note, her weight gain 30 pounds between 2000 and 2005. Again and another 40 pounds over the next 5 years. She had gastric bypass surgery in 2010 and lost 40 pounds and continued on CPAP +10.  She was re-titrated in 2010 and was placed on CPAP +11 cm and received a new machine.   I reviewed compliance data from 2013. She had 93% compliance and excellent efficacy with an AHI equals 3.9 on CPAP +11 cm.    Current weight is 223, up slightly  compared to last time I saw her in 2015.     She had a repeat PSG 02/14/16.   She had minimal OSA = 2.7 but had no REM sleep.      Migraines:   She has a long history of migraines x many years, but they were worse when she was in her 54s. She currently is only having migraines about 2-3 times a month and they usually go away with sumatriptan.  When she has a migraine,  she will get a pounding pain and nausea but usually not vomiting. She gets photophobia and phonophobia.   Insomnia:   She has insomnia with both sleep onset and sleep maintenance. Eszopiclone 4 mg (2 x 2 mg) nightly has helped.   She gained 12-15 pounds on Seroquel.     Trazodone had not helped (dose unknown)  RLS:   She had RLS, worse when her iron was low (now gets infusions).     Anxiety:     EPWORTH SLEEPINESS SCALE  On a scale of 0 - 3 what is the  chance of dozing:  Sitting and Reading:   0 Watching TV:    0 Sitting inactive in a public place: 0 Passenger in car for one hour: 0 Lying down to rest in the afternoon: 1 Sitting and talking to someone: 0 Sitting quietly after lunch:  0 In a car, stopped in traffic:  0  Total (out of 24):    1/24 (normal)  REVIEW OF SYSTEMS: Constitutional: No fevers, chills, sweats, or change in appetite Eyes: No visual changes, double vision, eye pain Ear, nose and throat: No hearing loss, ear pain, nasal congestion, sore throat Cardiovascular: No chest pain, palpitations Respiratory: No shortness of breath at rest or with exertion.   No wheezes.  Has OSA GastrointestinaI: No nausea, vomiting, diarrhea, abdominal pain, fecal incontinence Genitourinary: No dysuria, urinary retention or frequency.  No nocturia. Musculoskeletal: No neck pain, back pain Integumentary: No rash, pruritus, skin lesions Neurological: as above Psychiatric: No depression at this time.  No anxiety Endocrine: No palpitations, diaphoresis, change in appetite, change in weigh or increased thirst Hematologic/Lymphatic: No anemia now but had in past due to low iron.   No purpura, petechiae. Allergic/Immunologic: No itchy/runny eyes, nasal congestion, recent allergic reactions, rashes  ALLERGIES: Allergies  Allergen Reactions  . Sulfa Antibiotics Rash    fever    HOME MEDICATIONS:  Current Outpatient Medications:  .  aspirin EC 81 MG tablet, Take 81 mg by mouth. Takes occ, Disp: , Rfl:  .  Cholecalciferol (VITAMIN D PO), Take 1 tablet by mouth daily., Disp: , Rfl:  .  Cyanocobalamin (B-12 PO), Take 1 tablet by mouth daily., Disp: , Rfl:  .  gabapentin (NEURONTIN) 300 MG capsule, TAKE 1 CAPSULE BY MOUTH EVERYDAY AT BEDTIME, Disp: 90 capsule, Rfl: 0 .  losartan (COZAAR) 50 MG tablet, Take 50 mg by mouth 2 (two) times daily. , Disp: , Rfl:  .  Multiple Vitamin (MULTIVITAMIN) tablet, Take 1 tablet by mouth daily.  Centrum Silver daily, Disp: , Rfl:  .  Niacin (ENDUR-ACIN PO), Take 500 mg by mouth daily., Disp: , Rfl:  .  Nutritional Supplements (JUICE PLUS FIBRE PO), Take 1 tablet by mouth daily., Disp: , Rfl:  .  Omega-3 Fatty Acids (FISH OIL) 1000 MG CAPS, Take by mouth., Disp: , Rfl:  .  QUEtiapine (SEROQUEL) 25 MG tablet, Take 1-2 tabs po prn middle of the night awakening, Disp: 180 tablet, Rfl: 1 .  QUEtiapine (SEROQUEL) 50 MG tablet, Take 1 tablet (50 mg total) by mouth at bedtime. Taking 50-100 mg po QHS, Disp: 90 tablet, Rfl: 1 .  rosuvastatin (CRESTOR) 20 MG tablet, TAKE ONE TABLET (20 MG DOSE) BY MOUTH DAILY., Disp: , Rfl:  .  sertraline (ZOLOFT) 100 MG tablet, Take 2 tablets (200 mg total) by mouth daily., Disp: 180 tablet, Rfl: 2 .  SUMAtriptan (IMITREX) 100 MG tablet, TAKE 1 TABLET BY MOUTH ONCE AS NEEDED FOR MIGRAINE. MAY REPEAT IN 2 HRS IF HEADACHE PERSISTS OR RECURS, Disp: 10 tablet, Rfl: 0 .  Eszopiclone 3 MG TABS, TAKE 1 TABLET (3 MG TOTAL) BY MOUTH AT BEDTIME FOR 30 DAYS., Disp: 30 tablet, Rfl: 3  PAST MEDICAL HISTORY: Past Medical History:  Diagnosis Date  . Anemia    has received iron infusions  . Anxiety   . Arthritis    mid foot arthristis  . Cancer ([place])   . Carpal tunnel syndrome   . Headache   . Hyperlipidemia   . Hypertension   . Insomnia   . Migraine   . Obstructive sleep apnea   . PONV (postoperative nausea and vomiting)   . Skin cancer   . Skin cancer, basal cell   . Sleep apnea    wears cpap  . Vision loss of left eye    history of lazy eye    PAST SURGICAL HISTORY: Past Surgical History:  Procedure Laterality Date  . APPENDECTOMY    . BILATERAL CARPAL TUNNEL RELEASE    . CARDIAC CATHETERIZATION    . CESAREAN SECTION     x2  . DILITATION & CURRETTAGE/HYSTROSCOPY WITH NOVASURE ABLATION N/A 04/03/2016   Procedure: DILATATION & CURETTAGE/HYSTEROSCOPY WITH NOVASURE ABLATION;  Surgeon: Brien Few, MD;  Location: [facility];  Service: Gynecology;   Laterality: N/A;  . GASTRIC BYPASS    . skin cancer    . SKIN CANCER EXCISION      FAMILY HISTORY: Family History  Problem Relation Age of Onset  . Lung disease Mother   . Schizophrenia Mother   . Drug abuse Mother   . Heart disease Father   . Drug abuse Father   . Bipolar disorder Father   . Anxiety disorder Son   . Colon polyps Neg Hx   . Colon cancer Neg Hx   . Esophageal cancer Neg Hx   . Rectal cancer Neg Hx   . Stomach cancer Neg Hx     SOCIAL HISTORY:  Social History   Socioeconomic History  . Marital status: Married    Spouse name: Not on file  . Number of children: Not on file  . Years of education: Not on file  . Highest education level: Not on file  Occupational History  . Not on file  Tobacco Use  . Smoking status: Never Smoker  . Smokeless tobacco: Never Used  Vaping Use  . Vaping Use: Never used  Substance and Sexual Activity  . Alcohol use: Yes    Alcohol/week: 1.0 standard drink    Types: 1 Glasses of wine per week    Comment: wine rarely  . Drug use: No  . Sexual activity: Not on file  Other Topics Concern  . Not on file  Social History Narrative  . Not on file   Social Determinants of Health   Financial Resource Strain:   . Difficulty of Paying Living Expenses:   Food Insecurity:   . Worried About Charity fundraiser in the Last Year:   .  Ran Out of Food in the Last Year:   Transportation Needs:   . Film/video editor (Medical):   Marland Kitchen Lack of Transportation (Non-Medical):   Physical Activity:   . Days of Exercise per Week:   . Minutes of Exercise per Session:   Stress:   . Feeling of Stress :   Social Connections:   . Frequency of Communication with Friends and Family:   . Frequency of Social Gatherings with Friends and Family:   . Attends Religious Services:   . Active Member of Clubs or Organizations:   . Attends Archivist Meetings:   Marland Kitchen Marital Status:   Intimate Partner Violence:   . Fear of Current or  Ex-Partner:   . Emotionally Abused:   Marland Kitchen Physically Abused:   . Sexually Abused:      PHYSICAL EXAM  Vitals:   01/28/20 1524  BP: (!) 141/93  Pulse: 77  Weight: 241 lb (109.3 kg)  Height: 5' 3.5" (1.613 m)    Body mass index is 42.02 kg/m.   General: The patient is well-developed and well-nourished and in no acute distress  HEENT: Head is normocephalic and atraumatic. Pharynx is nonerythematous. There is mild crowding ([place] 2) The neck is supple   Neurologic Exam  Mental status: The patient is alert and oriented x 3 at the time of the examination. The patient has apparent normal recent and remote memory, with an apparently normal attention span and concentration ability.   Speech is normal.  Cranial nerves: Extraocular movements are full.  Facial strength and sensation is normal.  Trapezius strength is normal..  The tongue is midline, and the patient has symmetric elevation of the soft palate. No obvious hearing deficits are noted.  Motor: Muscle bulk is normal.  Muscle tone is normal.  Strength is 5/5.  Sensory: She has intact sensation to touch in the arms and legs..  Coordination: Finger-nose-finger and heel-to-shin is performed well.  Gait and station: Station is normal.   Gait and tandem gait are normal.  Romberg is negative.  Reflexes: Deep tendon reflexes are symmetric and normal bilaterally.    Marland Kitchen    DIAGNOSTIC DATA (LABS, IMAGING, TESTING) - I reviewed patient records, labs, notes, testing and imaging myself where available.  Lab Results  Component Value Date   WBC 6.8 03/29/2016   HGB 11.9 (L) 03/29/2016   HCT 35.0 (L) 03/29/2016   MCV 91.1 03/29/2016   PLT 261 03/29/2016      Component Value Date/Time   NA 134 (L) 03/29/2016 1135   K 3.5 03/29/2016 1135   CL 104 03/29/2016 1135   CO2 25 03/29/2016 1135   GLUCOSE 85 03/29/2016 1135   BUN 16 03/29/2016 1135   CREATININE 0.87 03/29/2016 1135   CALCIUM 9.2 03/29/2016 1135   GFRNONAA >60  03/29/2016 1135   GFRAA >60 03/29/2016 1135       ASSESSMENT AND PLAN  OSA on CPAP  Common migraine without intractability  Insomnia, unspecified type  Restless leg syndrome  1.   Use CPAP nightly. Her machine is old and we will look into a new one.   She may need another study first.  I will check with the sleep lab and order an appropriate study as needed 2.   Change Imitrex to Maxalt for migraine.   If not tolerated well consider Nurtec or Ubrelvy  3.   She will return to see me in 12 months or sooner if there are new or worsening neurologic  symptoms.   Dora Simeone A. Felecia Shelling, MD, PhD 3/67/2550, 0:16 PM Certified in Neurology, Clinical Neurophysiology, Sleep Medicine, Pain Medicine and Neuroimaging  [facility] [address], [place], [place] [address] [phone]

## 2020-01-29 ENCOUNTER — Encounter: Payer: Self-pay | Admitting: Neurology

## 2020-02-01 ENCOUNTER — Other Ambulatory Visit: Payer: Self-pay | Admitting: Neurology

## 2020-02-01 DIAGNOSIS — G4733 Obstructive sleep apnea (adult) (pediatric): Secondary | ICD-10-CM

## 2020-03-04 ENCOUNTER — Other Ambulatory Visit: Payer: Self-pay | Admitting: Psychiatry

## 2020-03-04 DIAGNOSIS — G4700 Insomnia, unspecified: Secondary | ICD-10-CM

## 2020-03-08 ENCOUNTER — Other Ambulatory Visit: Payer: Self-pay | Admitting: Psychiatry

## 2020-03-08 DIAGNOSIS — G4700 Insomnia, unspecified: Secondary | ICD-10-CM

## 2020-03-23 ENCOUNTER — Ambulatory Visit: Payer: BC Managed Care – PPO | Admitting: Neurology

## 2020-03-23 DIAGNOSIS — G4733 Obstructive sleep apnea (adult) (pediatric): Secondary | ICD-10-CM | POA: Diagnosis not present

## 2020-03-23 DIAGNOSIS — Z9989 Dependence on other enabling machines and devices: Secondary | ICD-10-CM

## 2020-03-25 ENCOUNTER — Ambulatory Visit (INDEPENDENT_AMBULATORY_CARE_PROVIDER_SITE_OTHER): Payer: BC Managed Care – PPO | Admitting: Psychiatry

## 2020-03-25 ENCOUNTER — Other Ambulatory Visit: Payer: Self-pay

## 2020-03-25 ENCOUNTER — Encounter: Payer: Self-pay | Admitting: Psychiatry

## 2020-03-25 DIAGNOSIS — G4700 Insomnia, unspecified: Secondary | ICD-10-CM | POA: Diagnosis not present

## 2020-03-25 DIAGNOSIS — F411 Generalized anxiety disorder: Secondary | ICD-10-CM

## 2020-03-25 DIAGNOSIS — F329 Major depressive disorder, single episode, unspecified: Secondary | ICD-10-CM | POA: Diagnosis not present

## 2020-03-25 DIAGNOSIS — F5101 Primary insomnia: Secondary | ICD-10-CM

## 2020-03-25 DIAGNOSIS — F32A Depression, unspecified: Secondary | ICD-10-CM

## 2020-03-25 MED ORDER — SERTRALINE HCL 100 MG PO TABS: 200.0000 mg | ORAL_TABLET | Freq: Every day | ORAL | 2 refills | Status: DC

## 2020-03-25 MED ORDER — GABAPENTIN 300 MG PO CAPS: ORAL_CAPSULE | ORAL | 1 refills | Status: DC

## 2020-03-25 MED ORDER — ESZOPICLONE 3 MG PO TABS: 3.0000 mg | ORAL_TABLET | Freq: Every day | ORAL | 5 refills | Status: DC

## 2020-03-25 MED ORDER — QUETIAPINE FUMARATE 25 MG PO TABS: ORAL_TABLET | ORAL | 1 refills | Status: DC

## 2020-03-25 MED ORDER — QUETIAPINE FUMARATE 50 MG PO TABS: 50.0000 mg | ORAL_TABLET | Freq: Every day | ORAL | 1 refills | Status: DC

## 2020-03-25 NOTE — Progress Notes (Signed)
Joan West [id number] 1965-09-09 54 y.o.  Subjective:   Patient ID:  Joan West is a 54 y.o. (DOB [birth year]) female.  Chief Complaint:  Chief Complaint  Patient presents with  . Follow-up    h/o Depression, Anxiety, and insomnia    HPI Joan West presents to the office today for follow-up of depression, anxiety, and insomnia. She reports that her anxiety has been ok. Denies depressed mood. Denies excessive irritability. Sleep has been "ok." Has had some increased RLS recently. She reports that RLS does not occur nightly. Reports that she will go to sleep for about 30 minutes and will feel like she cannot stopping moving her legs. Occ middle of the night awakenings. She reports that her energy and motivation have been good. Reports that she has been able to be productive over the summer. Appetite is stable. Denies concentration impairment. Denies SI.   Son was dx'd with Asperger's. She will return to work 04/04/20 and will be returning to same position.   Past medication trials: Cymbalta-helpful for depression but not anxiety Sertraline-effective for mood and anxiety. Thinks it may cause intrusive Citalopram Seroquel Trazodone-ineffective Melatonin-ineffective Benadryl-ineffective for insomnia Ambien-parasomnias Belsomra- unsure of benefit.  Trileptal-ineffective for insomnia or anxiety Gabapentin Lunesta    AIMS     Office Visit from 11/24/2019 in [facility]  AIMS Total Score 0       Review of Systems:  Review of Systems  Musculoskeletal: Negative for gait problem.  Neurological: Negative for tremors.       RLS  Psychiatric/Behavioral:       Please refer to HPI    Medications: I have reviewed the patient's current medications.  Current Outpatient Medications  Medication Sig Dispense Refill  . aspirin EC 81 MG tablet Take 81 mg by mouth. Takes occ    . Cholecalciferol (VITAMIN D PO) Take 1 tablet by mouth daily.    .  Cyanocobalamin (B-12 PO) Take 1 tablet by mouth daily.    Derrill Memo ON 05/06/2020] Eszopiclone 3 MG TABS Take 1 tablet (3 mg total) by mouth at bedtime. 30 tablet 5  . losartan (COZAAR) 50 MG tablet Take 50 mg by mouth 2 (two) times daily.     . Multiple Vitamin (MULTIVITAMIN) tablet Take 1 tablet by mouth daily. Centrum Silver daily    . Niacin (ENDUR-ACIN PO) Take 500 mg by mouth daily.    . Nutritional Supplements (JUICE PLUS FIBRE PO) Take 1 tablet by mouth daily.    . Omega-3 Fatty Acids (FISH OIL) 1000 MG CAPS Take by mouth.    . QUEtiapine (SEROQUEL) 25 MG tablet Take 1-2 tabs po prn middle of the night awakening 180 tablet 1  . QUEtiapine (SEROQUEL) 50 MG tablet Take 1 tablet (50 mg total) by mouth at bedtime. Taking 50-100 mg po QHS 90 tablet 1  . rosuvastatin (CRESTOR) 20 MG tablet TAKE ONE TABLET (20 MG DOSE) BY MOUTH DAILY.    Marland Kitchen sertraline (ZOLOFT) 100 MG tablet Take 2 tablets (200 mg total) by mouth daily. 180 tablet 2  . gabapentin (NEURONTIN) 300 MG capsule Take 1-2 tabs po QHS 180 capsule 1  . rizatriptan (MAXALT-MLT) 10 MG disintegrating tablet Take 1 tablet (10 mg total) by mouth as needed for migraine. May repeat in 2 hours if needed 9 tablet 11   No current facility-administered medications for this visit.    Medication Side Effects: None  Allergies:  Allergies  Allergen Reactions  . Sulfa Antibiotics Rash  fever    Past Medical History:  Diagnosis Date  . Anemia    has received iron infusions  . Anxiety   . Arthritis    mid foot arthristis  . Cancer ([place])   . Carpal tunnel syndrome   . Headache   . Hyperlipidemia   . Hypertension   . Insomnia   . Migraine   . Obstructive sleep apnea   . PONV (postoperative nausea and vomiting)   . Skin cancer   . Skin cancer, basal cell   . Sleep apnea    wears cpap  . Vision loss of left eye    history of lazy eye    Family History  Problem Relation Age of Onset  . Lung disease Mother   . Schizophrenia Mother    . Drug abuse Mother   . Heart disease Father   . Drug abuse Father   . Bipolar disorder Father   . Anxiety disorder Son   . Colon polyps Neg Hx   . Colon cancer Neg Hx   . Esophageal cancer Neg Hx   . Rectal cancer Neg Hx   . Stomach cancer Neg Hx     Social History   Socioeconomic History  . Marital status: Married    Spouse name: Not on file  . Number of children: Not on file  . Years of education: Not on file  . Highest education level: Not on file  Occupational History  . Not on file  Tobacco Use  . Smoking status: Never Smoker  . Smokeless tobacco: Never Used  Vaping Use  . Vaping Use: Never used  Substance and Sexual Activity  . Alcohol use: Yes    Alcohol/week: 1.0 standard drink    Types: 1 Glasses of wine per week    Comment: wine rarely  . Drug use: No  . Sexual activity: Not on file  Other Topics Concern  . Not on file  Social History Narrative  . Not on file   Social Determinants of Health   Financial Resource Strain:   . Difficulty of Paying Living Expenses:   Food Insecurity:   . Worried About Charity fundraiser in the Last Year:   . Arboriculturist in the Last Year:   Transportation Needs:   . Film/video editor (Medical):   Marland Kitchen Lack of Transportation (Non-Medical):   Physical Activity:   . Days of Exercise per Week:   . Minutes of Exercise per Session:   Stress:   . Feeling of Stress :   Social Connections:   . Frequency of Communication with Friends and Family:   . Frequency of Social Gatherings with Friends and Family:   . Attends Religious Services:   . Active Member of Clubs or Organizations:   . Attends Archivist Meetings:   Marland Kitchen Marital Status:   Intimate Partner Violence:   . Fear of Current or Ex-Partner:   . Emotionally Abused:   Marland Kitchen Physically Abused:   . Sexually Abused:     Past Medical History, Surgical history, Social history, and Family history were reviewed and updated as appropriate.   Please see review  of systems for further details on the patient's review from today.   Objective:   Physical Exam:  BP (!) 144/93   Pulse 78   Physical Exam Constitutional:      General: She is not in acute distress. Musculoskeletal:        General: No deformity.  Neurological:  Mental Status: She is alert and oriented to person, place, and time.     Coordination: Coordination normal.  Psychiatric:        Attention and Perception: Attention and perception normal. She does not perceive auditory or visual hallucinations.        Mood and Affect: Mood normal. Mood is not anxious or depressed. Affect is not labile, blunt, angry or inappropriate.        Speech: Speech normal.        Behavior: Behavior normal.        Thought Content: Thought content normal. Thought content is not paranoid or delusional. Thought content does not include homicidal or suicidal ideation. Thought content does not include homicidal or suicidal plan.        Cognition and Memory: Cognition and memory normal.        Judgment: Judgment normal.     Comments: Insight intact     Lab Review:     Component Value Date/Time   NA 134 (L) 03/29/2016 1135   K 3.5 03/29/2016 1135   CL 104 03/29/2016 1135   CO2 25 03/29/2016 1135   GLUCOSE 85 03/29/2016 1135   BUN 16 03/29/2016 1135   CREATININE 0.87 03/29/2016 1135   CALCIUM 9.2 03/29/2016 1135   GFRNONAA >60 03/29/2016 1135   GFRAA >60 03/29/2016 1135       Component Value Date/Time   WBC 6.8 03/29/2016 1135   RBC 3.84 (L) 03/29/2016 1135   HGB 11.9 (L) 03/29/2016 1135   HCT 35.0 (L) 03/29/2016 1135   PLT 261 03/29/2016 1135   MCV 91.1 03/29/2016 1135   MCH 31.0 03/29/2016 1135   MCHC 34.0 03/29/2016 1135   RDW 13.3 03/29/2016 1135   LYMPHSABS 3.0 03/15/2014 0540   MONOABS 0.6 03/15/2014 0540   EOSABS 0.2 03/15/2014 0540   BASOSABS 0.0 03/15/2014 0540    No results found for: POCLITH, LITHIUM   No results found for: PHENYTOIN, PHENOBARB, VALPROATE, CBMZ    .res Assessment: Plan:   Will increase gabapentin to 300 mg 1-2 tabs at bedtime for insomnia and restless leg syndrome.  Advised patient to contact office if RLS does not improve. Continue sertraline 200 mg daily for anxiety depression. Continue Lunesta 3 mg at bedtime for insomnia. Continue Seroquel 50 mg at bedtime for insomnia. Continue Seroquel 25 mg 1-2 tabs as needed for middle of the night awakenings. Patient to follow-up with this provider in 4 to 5 months or sooner if clinically indicated. Patient advised to contact office with any questions, adverse effects, or acute worsening in signs and symptoms.  Alese was seen today for follow-up.  Diagnoses and all orders for this visit:  Insomnia, unspecified type -     gabapentin (NEURONTIN) 300 MG capsule; Take 1-2 tabs po QHS -     Eszopiclone 3 MG TABS; Take 1 tablet (3 mg total) by mouth at bedtime.  Primary insomnia -     QUEtiapine (SEROQUEL) 25 MG tablet; Take 1-2 tabs po prn middle of the night awakening -     QUEtiapine (SEROQUEL) 50 MG tablet; Take 1 tablet (50 mg total) by mouth at bedtime. Taking 50-100 mg po QHS  Generalized anxiety disorder -     sertraline (ZOLOFT) 100 MG tablet; Take 2 tablets (200 mg total) by mouth daily.  Depression, unspecified depression type -     sertraline (ZOLOFT) 100 MG tablet; Take 2 tablets (200 mg total) by mouth daily.     Please see  After Visit Summary for patient specific instructions.  Future Appointments  Date Time Provider [place]  08/08/2020  1:00 PM Thayer Headings, PMHNP [facility] None  01/30/2021  1:00 PM Sater, Nanine Means, MD [facility] None    No orders of the defined types were placed in this encounter.   -------------------------------

## 2020-03-28 NOTE — Progress Notes (Signed)
   [facility]  HOME SLEEP STUDY  STUDY DATE: 03/23/2020 PATIENT NAME: Joan West DOB: 02/28/1966 MRN: [id number]  ORDERING CLINICIAN: Trinitey Roache A. Felecia Shelling, MD, PhD REFERRING CLINICIAN: Mariel Lukins A. Alpha Chouinard, MD. PhD   CLINICAL INFORMATION: 54 year old woman with obstructive sleep apnea  FINDINGS:  Total Record Time: 10 hours and 4 minutes Total Sleep Time:  7 hours 55 minutes  Percent REM:   15.7%   Calculated pAHI:  45.2       REM pAHI:    41.5     NREM pAHI: 45.9  Pulse Mean:    76  pulse Range 59-102    Oxygen Sat% Mean: 91%  O2Sat Range (84-98%)  O2Sat <88%: 29.6 minutes    IMPRESSION:  This home sleep study shows severe OSA with an AHI =  45.2   RECOMMENDATION: 1.   AutoPap 5 to 20 cm with a heated humidifier.  Check download in 30 and 90 days. 2.   Follow-up with Dr. Felecia Shelling   INTERPRETING PHYSICIAN:   Nanine Means. Felecia Shelling, MD, PhD, [facility] Certified in Neurology, Clinical Neurophysiology, Sleep Medicine, Pain Medicine and Neuroimaging  [facility] [address], [place], [place] [address] [phone]

## 2020-03-29 ENCOUNTER — Telehealth: Payer: Self-pay | Admitting: *Deleted

## 2020-03-29 ENCOUNTER — Other Ambulatory Visit: Payer: Self-pay | Admitting: *Deleted

## 2020-03-29 DIAGNOSIS — G4733 Obstructive sleep apnea (adult) (pediatric): Secondary | ICD-10-CM

## 2020-03-29 NOTE — Telephone Encounter (Signed)
Faxed signed order for new machine/Autopap 5-20cm to [place] at [phone]. Received fax confirmation.  Called pt to let her know order was faxed. [facility] should contact her in the next couple weeks to get set up. Asked her to call me if she does not hear from them. Set up f/u with AL,NP for 06/23/20 at 8am. Advised pt to make sure to bring new machine to this f/u appt. She verbalized understanding.

## 2020-03-30 NOTE — Telephone Encounter (Signed)
Faxed signed orders back to [facility] for cpap at [phone]. Received fax confirmation.

## 2020-06-23 ENCOUNTER — Ambulatory Visit: Payer: Self-pay | Admitting: Family Medicine

## 2020-06-28 ENCOUNTER — Other Ambulatory Visit: Payer: Self-pay | Admitting: Psychiatry

## 2020-06-28 DIAGNOSIS — F5101 Primary insomnia: Secondary | ICD-10-CM

## 2020-06-29 ENCOUNTER — Encounter: Payer: Self-pay | Admitting: Family Medicine

## 2020-06-29 ENCOUNTER — Ambulatory Visit: Payer: BC Managed Care – PPO | Admitting: Family Medicine

## 2020-06-29 ENCOUNTER — Other Ambulatory Visit: Payer: Self-pay

## 2020-06-29 VITALS — BP 142/88 | HR 68 | Ht 62.0 in | Wt 235.0 lb

## 2020-06-29 DIAGNOSIS — G4700 Insomnia, unspecified: Secondary | ICD-10-CM | POA: Diagnosis not present

## 2020-06-29 DIAGNOSIS — G4733 Obstructive sleep apnea (adult) (pediatric): Secondary | ICD-10-CM | POA: Diagnosis not present

## 2020-06-29 DIAGNOSIS — G43009 Migraine without aura, not intractable, without status migrainosus: Secondary | ICD-10-CM

## 2020-06-29 DIAGNOSIS — Z9989 Dependence on other enabling machines and devices: Secondary | ICD-10-CM | POA: Diagnosis not present

## 2020-06-29 MED ORDER — SUMATRIPTAN SUCCINATE 100 MG PO TABS: 100.0000 mg | ORAL_TABLET | Freq: Once | ORAL | 2 refills | Status: AC | PRN

## 2020-06-29 NOTE — Patient Instructions (Signed)
Please continue using your CPAP regularly. While your insurance requires that you use CPAP at least 4 hours each night on 70% of the nights, I recommend, that you not skip any nights and use it throughout the night if you can. Getting used to CPAP and staying with the treatment long term does take time and patience and discipline. Untreated obstructive sleep apnea when it is moderate to severe can have an adverse impact on cardiovascular health and raise her risk for heart disease, arrhythmias, hypertension, congestive heart failure, stroke and diabetes. Untreated obstructive sleep apnea causes sleep disruption, nonrestorative sleep, and sleep deprivation. This can have an impact on your day to day functioning and cause daytime sleepiness and impairment of cognitive function, memory loss, mood disturbance, and problems focussing. Using CPAP regularly can improve these symptoms.   Follow up in 1 year   Sleep Apnea Sleep apnea affects breathing during sleep. It causes breathing to stop for a short time or to become shallow. It can also increase the risk of:  Heart attack.  Stroke.  Being very overweight (obese).  Diabetes.  Heart failure.  Irregular heartbeat. The goal of treatment is to help you breathe normally again. What are the causes? There are three kinds of sleep apnea:  Obstructive sleep apnea. This is caused by a blocked or collapsed airway.  Central sleep apnea. This happens when the brain does not send the right signals to the muscles that control breathing.  Mixed sleep apnea. This is a combination of obstructive and central sleep apnea. The most common cause of this condition is a collapsed or blocked airway. This can happen if:  Your throat muscles are too relaxed.  Your tongue and tonsils are too large.  You are overweight.  Your airway is too small. What increases the risk?  Being overweight.  Smoking.  Having a small airway.  Being older.  Being  female.  Drinking alcohol.  Taking medicines to calm yourself (sedatives or tranquilizers).  Having family members with the condition. What are the signs or symptoms?  Trouble staying asleep.  Being sleepy or tired during the day.  Getting angry a lot.  Loud snoring.  Headaches in the morning.  Not being able to focus your mind (concentrate).  Forgetting things.  Less interest in sex.  Mood swings.  Personality changes.  Feelings of sadness (depression).  Waking up a lot during the night to pee (urinate).  Dry mouth.  Sore throat. How is this diagnosed?  Your medical history.  A physical exam.  A test that is done when you are sleeping (sleep study). The test is most often done in a sleep lab but may also be done at home. How is this treated?   Sleeping on your side.  Using a medicine to get rid of mucus in your nose (decongestant).  Avoiding the use of alcohol, medicines to help you relax, or certain pain medicines (narcotics).  Losing weight, if needed.  Changing your diet.  Not smoking.  Using a machine to open your airway while you sleep, such as: ? An oral appliance. This is a mouthpiece that shifts your lower jaw forward. ? A CPAP device. This device blows air through a mask when you breathe out (exhale). ? An EPAP device. This has valves that you put in each nostril. ? A BPAP device. This device blows air through a mask when you breathe in (inhale) and breathe out.  Having surgery if other treatments do not work. It is   important to get treatment for sleep apnea. Without treatment, it can lead to:  High blood pressure.  Coronary artery disease.  In men, not being able to have an erection (impotence).  Reduced thinking ability. Follow these instructions at home: Lifestyle  Make changes that your doctor recommends.  Eat a healthy diet.  Lose weight if needed.  Avoid alcohol, medicines to help you relax, and some pain  medicines.  Do not use any products that contain nicotine or tobacco, such as cigarettes, e-cigarettes, and chewing tobacco. If you need help quitting, ask your doctor. General instructions  Take over-the-counter and prescription medicines only as told by your doctor.  If you were given a machine to use while you sleep, use it only as told by your doctor.  If you are having surgery, make sure to tell your doctor you have sleep apnea. You may need to bring your device with you.  Keep all follow-up visits as told by your doctor. This is important. Contact a doctor if:  The machine that you were given to use during sleep bothers you or does not seem to be working.  You do not get better.  You get worse. Get help right away if:  Your chest hurts.  You have trouble breathing in enough air.  You have an uncomfortable feeling in your back, arms, or stomach.  You have trouble talking.  One side of your body feels weak.  A part of your face is hanging down. These symptoms may be an emergency. Do not wait to see if the symptoms will go away. Get medical help right away. Call your local emergency services (911 in the [place] Do not drive yourself to the hospital. Summary  This condition affects breathing during sleep.  The most common cause is a collapsed or blocked airway.  The goal of treatment is to help you breathe normally while you sleep. This information is not intended to replace advice given to you by your health care provider. Make sure you discuss any questions you have with your health care provider. Document Revised: 05/23/2018 Document Reviewed: 04/01/2018 Elsevier Patient Education  2020 [employer]  

## 2020-06-29 NOTE — Progress Notes (Signed)
PATIENT: Joan West DOB: 1965-11-08  REASON FOR VISIT: follow up HISTORY FROM: patient  Chief Complaint  Patient presents with  . Follow-up  . Sleep Apnea    pt says her husband saw she takes her mask off at night once.     HISTORY OF PRESENT ILLNESS: Today 06/29/20 Joan West is a 54 y.o. female here today for follow up for OSA on CPAP.  She is doing well. She does report some insomnia and 1 episode of restless sleep in the setting of drinking wine. She is followed by Thayer Headings, psychiatry. She feels mood and migraines are well managed. She feels that sumatriptan worked better than rizatriptan.   Compliance report dated 05/29/2020-06/27/2020 reveals she used CPAP 30/30 days for compliance of 100%. She used CPAP greater than 4 hours 30/30 days for compliance of 100%. Residual AHI 3.5 on 5-20cmH20. No significant leak noted.     REVIEW OF SYSTEMS: Out of a complete 14 system review of symptoms, the patient complains only of the following symptoms, insomnia, headaches and all other reviewed systems are negative.  ALLERGIES: Allergies  Allergen Reactions  . Sulfa Antibiotics Rash    fever    HOME MEDICATIONS: Outpatient Medications Prior to Visit  Medication Sig Dispense Refill  . aspirin EC 81 MG tablet Take 81 mg by mouth. Takes occ    . Cholecalciferol (VITAMIN D PO) Take 1 tablet by mouth daily.    . Cyanocobalamin (B-12 PO) Take 1 tablet by mouth daily.    Marland Kitchen gabapentin (NEURONTIN) 300 MG capsule Take 1-2 tabs po QHS 180 capsule 1  . losartan (COZAAR) 50 MG tablet Take 50 mg by mouth 2 (two) times daily.     . Multiple Vitamin (MULTIVITAMIN) tablet Take 1 tablet by mouth daily. Centrum Silver daily    . Niacin (ENDUR-ACIN PO) Take 500 mg by mouth daily.    . Nutritional Supplements (JUICE PLUS FIBRE PO) Take 1 tablet by mouth daily.    . Omega-3 Fatty Acids (FISH OIL) 1000 MG CAPS Take by mouth.    . QUEtiapine (SEROQUEL) 25 MG tablet Take 1-2 tabs po  prn middle of the night awakening 180 tablet 1  . QUEtiapine (SEROQUEL) 50 MG tablet Take 1 tablet (50 mg total) by mouth at bedtime. Taking 50-100 mg po QHS 90 tablet 1  . rosuvastatin (CRESTOR) 20 MG tablet TAKE ONE TABLET (20 MG DOSE) BY MOUTH DAILY.    Marland Kitchen sertraline (ZOLOFT) 100 MG tablet Take 2 tablets (200 mg total) by mouth daily. 180 tablet 2  . rizatriptan (MAXALT-MLT) 10 MG disintegrating tablet Take 1 tablet (10 mg total) by mouth as needed for migraine. May repeat in 2 hours if needed 9 tablet 11  . Eszopiclone 3 MG TABS Take 1 tablet (3 mg total) by mouth at bedtime. 30 tablet 5   No facility-administered medications prior to visit.    PAST MEDICAL HISTORY: Past Medical History:  Diagnosis Date  . Anemia    has received iron infusions  . Anxiety   . Arthritis    mid foot arthristis  . Cancer ([place])   . Carpal tunnel syndrome   . Headache   . Hyperlipidemia   . Hypertension   . Insomnia   . Migraine   . Obstructive sleep apnea   . PONV (postoperative nausea and vomiting)   . Skin cancer   . Skin cancer, basal cell   . Sleep apnea    wears cpap  .  Vision loss of left eye    history of lazy eye    PAST SURGICAL HISTORY: Past Surgical History:  Procedure Laterality Date  . APPENDECTOMY    . BILATERAL CARPAL TUNNEL RELEASE    . CARDIAC CATHETERIZATION    . CESAREAN SECTION     x2  . DILITATION & CURRETTAGE/HYSTROSCOPY WITH NOVASURE ABLATION N/A 04/03/2016   Procedure: DILATATION & CURETTAGE/HYSTEROSCOPY WITH NOVASURE ABLATION;  Surgeon: Brien Few, MD;  Location: [facility];  Service: Gynecology;  Laterality: N/A;  . GASTRIC BYPASS    . skin cancer    . SKIN CANCER EXCISION      FAMILY HISTORY: Family History  Problem Relation Age of Onset  . Lung disease Mother   . Schizophrenia Mother   . Drug abuse Mother   . Heart disease Father   . Drug abuse Father   . Bipolar disorder Father   . Anxiety disorder Son   . Colon polyps Neg Hx   . Colon cancer Neg  Hx   . Esophageal cancer Neg Hx   . Rectal cancer Neg Hx   . Stomach cancer Neg Hx     SOCIAL HISTORY: Social History   Socioeconomic History  . Marital status: Married    Spouse name: Not on file  . Number of children: Not on file  . Years of education: Not on file  . Highest education level: Not on file  Occupational History  . Not on file  Tobacco Use  . Smoking status: Never Smoker  . Smokeless tobacco: Never Used  Vaping Use  . Vaping Use: Never used  Substance and Sexual Activity  . Alcohol use: Yes    Alcohol/week: 1.0 standard drink    Types: 1 Glasses of wine per week    Comment: wine rarely  . Drug use: No  . Sexual activity: Not on file  Other Topics Concern  . Not on file  Social History Narrative  . Not on file   Social Determinants of Health   Financial Resource Strain:   . Difficulty of Paying Living Expenses: Not on file  Food Insecurity:   . Worried About Charity fundraiser in the Last Year: Not on file  . Ran Out of Food in the Last Year: Not on file  Transportation Needs:   . Lack of Transportation (Medical): Not on file  . Lack of Transportation (Non-Medical): Not on file  Physical Activity:   . Days of Exercise per Week: Not on file  . Minutes of Exercise per Session: Not on file  Stress:   . Feeling of Stress : Not on file  Social Connections:   . Frequency of Communication with Friends and Family: Not on file  . Frequency of Social Gatherings with Friends and Family: Not on file  . Attends Religious Services: Not on file  . Active Member of Clubs or Organizations: Not on file  . Attends Archivist Meetings: Not on file  . Marital Status: Not on file  Intimate Partner Violence:   . Fear of Current or Ex-Partner: Not on file  . Emotionally Abused: Not on file  . Physically Abused: Not on file  . Sexually Abused: Not on file     PHYSICAL EXAM  Vitals:   06/29/20 1113  BP: (!) 142/88  Pulse: 68  Weight: 235 lb (106.6  kg)  Height: 5\' 2"  (1.575 m)   Body mass index is 42.98 kg/m.  Generalized: Well developed, in no acute distress  Cardiology: normal rate and rhythm, no murmur noted Respiratory: clear to auscultation bilaterally  Neurological examination  Mentation: Alert oriented to time, place, history taking. Follows all commands speech and language fluent Cranial nerve II-XII: Pupils were equal round reactive to light. Extraocular movements were full, visual field were full  Motor: The motor testing reveals 5 over 5 strength of all 4 extremities. Good symmetric motor tone is noted throughout.  Gait and station: Gait is normal.    DIAGNOSTIC DATA (LABS, IMAGING, TESTING) - I reviewed patient records, labs, notes, testing and imaging myself where available.  No flowsheet data found.   Lab Results  Component Value Date   WBC 6.8 03/29/2016   HGB 11.9 (L) 03/29/2016   HCT 35.0 (L) 03/29/2016   MCV 91.1 03/29/2016   PLT 261 03/29/2016      Component Value Date/Time   NA 134 (L) 03/29/2016 1135   K 3.5 03/29/2016 1135   CL 104 03/29/2016 1135   CO2 25 03/29/2016 1135   GLUCOSE 85 03/29/2016 1135   BUN 16 03/29/2016 1135   CREATININE 0.87 03/29/2016 1135   CALCIUM 9.2 03/29/2016 1135   GFRNONAA >60 03/29/2016 1135   GFRAA >60 03/29/2016 1135   No results found for: CHOL, HDL, LDLCALC, LDLDIRECT, TRIG, CHOLHDL No results found for: HGBA1C No results found for: VITAMINB12 No results found for: TSH   ASSESSMENT AND PLAN 54 y.o. year old female  has a past medical history of Anemia, Anxiety, Arthritis, Cancer ([place]), Carpal tunnel syndrome, Headache, Hyperlipidemia, Hypertension, Insomnia, Migraine, Obstructive sleep apnea, PONV (postoperative nausea and vomiting), Skin cancer, Skin cancer, basal cell, Sleep apnea, and Vision loss of left eye. here with     ICD-10-CM   1. OSA on CPAP  G47.33    Z99.89   2. Common migraine without intractability  G43.009   3. Insomnia, unspecified type   G47.00      EVERLENE CUNNING is doing well on CPAP therapy. She was encouraged to continue using CPAP nightly and for greater than 4 hours each night. We will update supply orders as indicated. Risks of untreated sleep apnea review and education materials provided. Healthy lifestyle habits encouraged. female will follow up in 1 year, sooner if needed. She verbalizes understanding and agreement with this plan.    No orders of the defined types were placed in this encounter.    Meds ordered this encounter  Medications  . SUMAtriptan (IMITREX) 100 MG tablet    Sig: Take 1 tablet (100 mg total) by mouth once as needed for up to 1 dose for migraine. May repeat in 2 hours if headache persists or recurs.    Dispense:  10 tablet    Refill:  2    Order Specific Question:   Supervising Provider    Answer:   Melvenia Beam [id number]      I spent 15 minutes with the patient. 50% of this time was spent counseling and educating patient on plan of care and medications.    Debbora Presto, FNP-C 06/29/2020, 12:09 PM [facility] [address] [place], [place] [address] [phone]

## 2020-08-08 ENCOUNTER — Ambulatory Visit (INDEPENDENT_AMBULATORY_CARE_PROVIDER_SITE_OTHER): Payer: BC Managed Care – PPO | Admitting: Psychiatry

## 2020-08-08 ENCOUNTER — Encounter: Payer: Self-pay | Admitting: Psychiatry

## 2020-08-08 ENCOUNTER — Other Ambulatory Visit: Payer: Self-pay

## 2020-08-08 DIAGNOSIS — G4700 Insomnia, unspecified: Secondary | ICD-10-CM | POA: Diagnosis not present

## 2020-08-08 DIAGNOSIS — F5101 Primary insomnia: Secondary | ICD-10-CM

## 2020-08-08 DIAGNOSIS — F411 Generalized anxiety disorder: Secondary | ICD-10-CM | POA: Diagnosis not present

## 2020-08-08 DIAGNOSIS — F32A Depression, unspecified: Secondary | ICD-10-CM | POA: Diagnosis not present

## 2020-08-08 MED ORDER — GABAPENTIN 300 MG PO CAPS: ORAL_CAPSULE | ORAL | 1 refills | Status: DC

## 2020-08-08 MED ORDER — ESZOPICLONE 3 MG PO TABS: 3.0000 mg | ORAL_TABLET | Freq: Every day | ORAL | 5 refills | Status: DC

## 2020-08-08 MED ORDER — QUETIAPINE FUMARATE 25 MG PO TABS: ORAL_TABLET | ORAL | 1 refills | Status: DC

## 2020-08-08 MED ORDER — SERTRALINE HCL 100 MG PO TABS: 200.0000 mg | ORAL_TABLET | Freq: Every day | ORAL | 2 refills | Status: DC

## 2020-08-08 MED ORDER — QUETIAPINE FUMARATE 50 MG PO TABS: 50.0000 mg | ORAL_TABLET | Freq: Every day | ORAL | 0 refills | Status: DC

## 2020-08-08 NOTE — Progress Notes (Signed)
°   08/08/20 1327  Facial and Oral Movements  Muscles of Facial Expression 0  Lips and Perioral Area 0  Jaw 0  Tongue 0  Extremity Movements  Upper (arms, wrists, hands, fingers) 0  Lower (legs, knees, ankles, toes) 0  Trunk Movements  Neck, shoulders, hips 0  Overall Severity  Severity of abnormal movements (highest score from questions above) 0  Incapacitation due to abnormal movements 0  Patient's awareness of abnormal movements (rate only patient's report) 1 (Reports noticing lip quiver on rare occasions)  AIMS Total Score  AIMS Total Score 1

## 2020-08-08 NOTE — Progress Notes (Signed)
Joan West [id number] 01/15/1966 54 y.o.  Subjective:   Patient ID:  Joan West is a 54 y.o. (DOB 09-02-1965) female.  Chief Complaint:  Chief Complaint  Patient presents with  . Follow-up    H/o depression, anxiety, and insomnia    HPI Joan West presents to the office today for follow-up of depression, anxiety, and insomnia. "I've been doing really good." She is enjoying being back in person. She reports that she has some "anxious moments" in response to wanting to meet son's needs and she and her husband have different approaches. Denies any other anxiety. Denies depressed mood. "The Zoloft is such as blessing to me... I can handle things." She falls asleep without difficulty. She reports occ middle of the night awakenings. She reports that she will take Seroquel 25 mg and be able to return to sleep. She reports that if she takes it before 3 am she does not feel groggy the following day. She has had a recurrent nightmares of being unable to breathe and has some anxiety about COVID. Appetite has been good. Energy and motivation have been good. Concentration is adequate. Denies SI.   She reports that they are learning how to meet son's needs after he was dx'd with Asperger's. Son has been receiving therapy and this has been helpful. Reports that son is performing well academically. Has a 45 minute drive. Currently on winter break. Going to see some family sporadically over the break. Some extended family members have had [place].   Wears cPap regularly.   Past medication trials: Cymbalta-helpful for depression but not anxiety Sertraline-effective for mood and anxiety. Thinks it may cause intrusive Citalopram Seroquel Trazodone-ineffective Melatonin-ineffective Benadryl-ineffective for insomnia Ambien-parasomnias Belsomra- unsure of benefit. Trileptal-ineffective for insomnia or anxiety Gabapentin [place] Office Visit from 08/08/2020 in  [facility] Office Visit from 11/24/2019 in [facility]  AIMS Total Score 1 0       Review of Systems:  Review of Systems  Endocrine:       Reports occasional hot flashes  Musculoskeletal: Negative for gait problem.  Neurological: Negative for tremors.  Psychiatric/Behavioral:       Please refer to HPI   Has upcoming annual physical in January.   Medications: I have reviewed the patient's current medications.  Current Outpatient Medications  Medication Sig Dispense Refill  . aspirin EC 81 MG tablet Take 81 mg by mouth. Takes occ    . Cholecalciferol (VITAMIN D PO) Take 1 tablet by mouth daily.    . Cyanocobalamin (B-12 PO) Take 1 tablet by mouth daily.    Marland Kitchen losartan (COZAAR) 50 MG tablet Take 50 mg by mouth 2 (two) times daily.     . Multiple Vitamin (MULTIVITAMIN) tablet Take 1 tablet by mouth daily. Centrum Silver daily    . Niacin (ENDUR-ACIN PO) Take 500 mg by mouth daily.    . Nutritional Supplements (JUICE PLUS FIBRE PO) Take 1 tablet by mouth daily.    . Omega-3 Fatty Acids (FISH OIL) 1000 MG CAPS Take by mouth.    . rosuvastatin (CRESTOR) 20 MG tablet TAKE ONE TABLET (20 MG DOSE) BY MOUTH DAILY.    . SUMAtriptan (IMITREX) 100 MG tablet Take 1 tablet (100 mg total) by mouth once as needed for up to 1 dose for migraine. May repeat in 2 hours if headache persists or recurs. 10 tablet 2  . [START ON 09/28/2020] Eszopiclone 3 MG TABS Take 1 tablet (  3 mg total) by mouth at bedtime. 30 tablet 5  . gabapentin (NEURONTIN) 300 MG capsule Take 1-2 tabs po QHS 180 capsule 1  . QUEtiapine (SEROQUEL) 25 MG tablet Take 1-2 tabs po prn middle of the night awakening 180 tablet 1  . QUEtiapine (SEROQUEL) 50 MG tablet Take 1-2 tablets (50-100 mg total) by mouth at bedtime. 90 tablet 0  . sertraline (ZOLOFT) 100 MG tablet Take 2 tablets (200 mg total) by mouth daily. 180 tablet 2   No current facility-administered medications for this visit.    Medication Side  Effects: None  Allergies:  Allergies  Allergen Reactions  . Sulfa Antibiotics Rash    fever    Past Medical History:  Diagnosis Date  . Anemia    has received iron infusions  . Anxiety   . Arthritis    mid foot arthristis  . Cancer ([place])   . Carpal tunnel syndrome   . Headache   . Hyperlipidemia   . Hypertension   . Insomnia   . Migraine   . Obstructive sleep apnea   . PONV (postoperative nausea and vomiting)   . Skin cancer   . Skin cancer, basal cell   . Sleep apnea    wears cpap  . Vision loss of left eye    history of lazy eye    Family History  Problem Relation Age of Onset  . Lung disease Mother   . Schizophrenia Mother   . Drug abuse Mother   . Heart disease Father   . Drug abuse Father   . Bipolar disorder Father   . Anxiety disorder Son   . Colon polyps Neg Hx   . Colon cancer Neg Hx   . Esophageal cancer Neg Hx   . Rectal cancer Neg Hx   . Stomach cancer Neg Hx     Social History   Socioeconomic History  . Marital status: Married    Spouse name: Not on file  . Number of children: Not on file  . Years of education: Not on file  . Highest education level: Not on file  Occupational History  . Not on file  Tobacco Use  . Smoking status: Never Smoker  . Smokeless tobacco: Never Used  Vaping Use  . Vaping Use: Never used  Substance and Sexual Activity  . Alcohol use: Yes    Alcohol/week: 1.0 standard drink    Types: 1 Glasses of wine per week    Comment: wine rarely  . Drug use: No  . Sexual activity: Not on file  Other Topics Concern  . Not on file  Social History Narrative  . Not on file   Social Determinants of Health   Financial Resource Strain: Not on file  Food Insecurity: Not on file  Transportation Needs: Not on file  Physical Activity: Not on file  Stress: Not on file  Social Connections: Not on file  Intimate Partner Violence: Not on file    Past Medical History, Surgical history, Social history, and Family history  were reviewed and updated as appropriate.   Please see review of systems for further details on the patient's review from today.   Objective:   Physical Exam:  There were no vitals taken for this visit.  Physical Exam Constitutional:      General: She is not in acute distress. Musculoskeletal:        General: No deformity.  Neurological:     Mental Status: She is alert and oriented to  person, place, and time.     Coordination: Coordination normal.  Psychiatric:        Attention and Perception: Attention and perception normal. She does not perceive auditory or visual hallucinations.        Mood and Affect: Mood normal. Mood is not anxious or depressed. Affect is not labile, blunt, angry or inappropriate.        Speech: Speech normal.        Behavior: Behavior normal.        Thought Content: Thought content normal. Thought content is not paranoid or delusional. Thought content does not include homicidal or suicidal ideation. Thought content does not include homicidal or suicidal plan.        Cognition and Memory: Cognition and memory normal.        Judgment: Judgment normal.     Comments: Insight intact     Lab Review:     Component Value Date/Time   NA 134 (L) 03/29/2016 1135   K 3.5 03/29/2016 1135   CL 104 03/29/2016 1135   CO2 25 03/29/2016 1135   GLUCOSE 85 03/29/2016 1135   BUN 16 03/29/2016 1135   CREATININE 0.87 03/29/2016 1135   CALCIUM 9.2 03/29/2016 1135   GFRNONAA >60 03/29/2016 1135   GFRAA >60 03/29/2016 1135       Component Value Date/Time   WBC 6.8 03/29/2016 1135   RBC 3.84 (L) 03/29/2016 1135   HGB 11.9 (L) 03/29/2016 1135   HCT 35.0 (L) 03/29/2016 1135   PLT 261 03/29/2016 1135   MCV 91.1 03/29/2016 1135   MCH 31.0 03/29/2016 1135   MCHC 34.0 03/29/2016 1135   RDW 13.3 03/29/2016 1135   LYMPHSABS 3.0 03/15/2014 0540   MONOABS 0.6 03/15/2014 0540   EOSABS 0.2 03/15/2014 0540   BASOSABS 0.0 03/15/2014 0540    No results found for: POCLITH,  LITHIUM   No results found for: PHENYTOIN, PHENOBARB, VALPROATE, CBMZ   .res Assessment: Plan:    Will continue current plan of care since target signs and symptoms are well controlled without any tolerability issues. Continue Sertraline 200 mg po qd for depression and anxiety.  Continue Seroquel 50 mg po QHS and 25 mg as needed for middle of the night awakenings.  Continue Lunesta 3 mg po QHS for insomnia.  Continue Gabapentin 300 mg 1-2 tabs po QHS.  Pt to follow-up in 6 months or sooner if clinically indicated.  Patient advised to contact office with any questions, adverse effects, or acute worsening in signs and symptoms.   Nera was seen today for follow-up.  Diagnoses and all orders for this visit:  Insomnia, unspecified type -     gabapentin (NEURONTIN) 300 MG capsule; Take 1-2 tabs po QHS -     Eszopiclone 3 MG TABS; Take 1 tablet (3 mg total) by mouth at bedtime.  Primary insomnia -     QUEtiapine (SEROQUEL) 25 MG tablet; Take 1-2 tabs po prn middle of the night awakening -     QUEtiapine (SEROQUEL) 50 MG tablet; Take 1-2 tablets (50-100 mg total) by mouth at bedtime.  Generalized anxiety disorder -     sertraline (ZOLOFT) 100 MG tablet; Take 2 tablets (200 mg total) by mouth daily.  Depression, unspecified depression type -     sertraline (ZOLOFT) 100 MG tablet; Take 2 tablets (200 mg total) by mouth daily.     Please see After Visit Summary for patient specific instructions.  Future Appointments  Date Time Provider Department  Center  01/30/2021  1:00 PM Sater, Nanine Means, MD [facility] None  02/06/2021 11:30 AM Thayer Headings, PMHNP [facility] None  06/28/2021 11:00 AM Lomax, Amy, NP [facility] None    No orders of the defined types were placed in this encounter.   -------------------------------

## 2020-08-25 ENCOUNTER — Other Ambulatory Visit: Payer: Self-pay | Admitting: Psychiatry

## 2020-08-25 DIAGNOSIS — F5101 Primary insomnia: Secondary | ICD-10-CM

## 2020-12-06 ENCOUNTER — Telehealth: Payer: Self-pay

## 2020-12-06 NOTE — Telephone Encounter (Signed)
Referral notes sent from [facility], Phone #: [phone], Fax #: [phone]   Notes sent to scheduling

## 2021-01-29 ENCOUNTER — Other Ambulatory Visit: Payer: Self-pay | Admitting: Neurology

## 2021-01-29 DIAGNOSIS — I10 Essential (primary) hypertension: Secondary | ICD-10-CM | POA: Insufficient documentation

## 2021-01-29 DIAGNOSIS — R931 Abnormal findings on diagnostic imaging of heart and coronary circulation: Secondary | ICD-10-CM | POA: Insufficient documentation

## 2021-01-29 NOTE — Progress Notes (Signed)
Cardiology Office Note   Date:  01/30/2021   ID:  [name], DOB 15-Jun-1966, MRN [id number]  PCP:  Elisabeth Cara, PA-C  Cardiologist:   None Referring:  None  Chief Complaint  Patient presents with   Coronary Artery Disease      History of Present Illness: Joan West is a 55 y.o. female who presents for evaluation of CAD and HTN.  She has a very strong family history of obstructive coronary disease with her father dying at age 27 with coronary disease and her brother already has an LVAD.  She was seeing another cardiology group in the region.  She actually had a stress echocardiogram which I think was equivocal.  I do see a cardiac catheterization in July 2020 that demonstrated minor luminal irregularities in multiple vessels with the most focal lesion being a 20 to 30% mid circumflex stenosis.  She denies any ongoing cardiovascular symptoms.  She has had a problem with her weight and actually had gastric bypass.  She ultimately lost about 40 pounds.  However, she is always been disappointed by that result.  She does not really follow with diet as carefully as she should.  She does not exercise routinely as she should.  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  With this she denies any cardiovascular symptoms. The patient denies any new symptoms such as chest discomfort, neck or arm discomfort. There has been no new shortness of breath, PND or orthopnea. There have been no reported palpitations, presyncope or syncope.    Past Medical History:  Diagnosis Date   Anemia    has received iron infusions   Anxiety    Arthritis    mid foot arthristis   Cancer ([place])    Carpal tunnel syndrome    Headache    Hyperlipidemia    Hypertension    Insomnia    Migraine    Obstructive sleep apnea    PONV (postoperative nausea and vomiting)    Skin cancer    Skin cancer, basal cell    Sleep apnea    wears cpap   Vision loss of left eye    history of lazy  eye    Past Surgical History:  Procedure Laterality Date   APPENDECTOMY     BILATERAL CARPAL TUNNEL RELEASE     CARDIAC CATHETERIZATION     CESAREAN SECTION     x2   DILITATION & CURRETTAGE/HYSTROSCOPY WITH NOVASURE ABLATION N/A 04/03/2016   Procedure: DILATATION & CURETTAGE/HYSTEROSCOPY WITH NOVASURE ABLATION;  Surgeon: Brien Few, MD;  Location: [facility];  Service: Gynecology;  Laterality: N/A;   GASTRIC BYPASS     skin cancer     SKIN CANCER EXCISION       Current Outpatient Medications  Medication Sig Dispense Refill   amLODipine (NORVASC) 5 MG tablet Take 1 tablet by mouth daily.     Cholecalciferol (VITAMIN D PO) Take 1 tablet by mouth daily.     Cyanocobalamin (B-12 PO) Take 1 tablet by mouth daily.     Eszopiclone 3 MG TABS Take 1 tablet (3 mg total) by mouth at bedtime. 30 tablet 5   gabapentin (NEURONTIN) 300 MG capsule Take 1-2 tabs po QHS 180 capsule 1   losartan (COZAAR) 50 MG tablet Take 50 mg by mouth 2 (two) times daily.      Multiple Vitamin (MULTIVITAMIN) tablet Take 1 tablet by mouth daily. Centrum Silver daily  Niacin (ENDUR-ACIN PO) Take 500 mg by mouth daily.     Nutritional Supplements (JUICE PLUS FIBRE PO) Take 1 tablet by mouth daily.     Omega-3 Fatty Acids (FISH OIL) 1000 MG CAPS Take by mouth.     QUEtiapine (SEROQUEL) 25 MG tablet Take 1-2 tabs po prn middle of the night awakening 180 tablet 1   QUEtiapine (SEROQUEL) 50 MG tablet TAKE 1-2 TABLETS BY MOUTH AT BEDTIME. 180 tablet 1   rizatriptan (MAXALT-MLT) 10 MG disintegrating tablet Take by mouth.     rosuvastatin (CRESTOR) 20 MG tablet TAKE ONE TABLET (20 MG DOSE) BY MOUTH DAILY.     sertraline (ZOLOFT) 100 MG tablet Take 2 tablets (200 mg total) by mouth daily. 180 tablet 2   SUMAtriptan (IMITREX) 100 MG tablet Take 1 tablet (100 mg total) by mouth once as needed for up to 1 dose for migraine. May repeat in 2 hours if headache persists or recurs. 10 tablet 2   aspirin EC 81 MG tablet Take 81 mg  by mouth. Takes occ (Patient not taking: Reported on 01/30/2021)     No current facility-administered medications for this visit.    Allergies:   Sulfa antibiotics    Social History:  The patient  reports that she has never smoked. She has never used smokeless tobacco. She reports current alcohol use of about 1.0 standard drink of alcohol per week. She reports that she does not use drugs.   Family History:  The patient's family history includes Anxiety disorder in her son; Bipolar disorder in her father; Drug abuse in her father and mother; Heart attack in her brother and father; Heart disease in her father; Lung disease in her mother; Schizophrenia in her mother.    ROS:  Please see the history of present illness.   Otherwise, review of systems are positive for none.   All other systems are reviewed and negative.    PHYSICAL EXAM: VS:  BP 128/84 (BP Location: Left Arm, Patient Position: Sitting, Cuff Size: Large)   Pulse 75   Ht 5\' 3"  (1.6 m)   Wt 238 lb (108 kg)   BMI 42.16 kg/m  , BMI Body mass index is 42.16 kg/m. GENERAL:  Well appearing HEENT:  Pupils equal round and reactive, fundi not visualized, oral mucosa unremarkable NECK:  No jugular venous distention, waveform within normal limits, carotid upstroke brisk and symmetric, no bruits, no thyromegaly LYMPHATICS:  No cervical, inguinal adenopathy LUNGS:  Clear to auscultation bilaterally BACK:  No CVA tenderness CHEST:  Unremarkable HEART:  PMI not displaced or sustained,S1 and S2 within normal limits, no S3, no S4, no clicks, no rubs, no murmurs ABD:  Flat, positive bowel sounds normal in frequency in pitch, no bruits, no rebound, no guarding, no midline pulsatile mass, no hepatomegaly, no splenomegaly EXT:  2 plus pulses throughout, no edema, no cyanosis no clubbing SKIN:  No rashes no nodules NEURO:  Cranial nerves II through XII grossly intact, motor grossly intact throughout PSYCH:  Cognitively intact, oriented to  person place and time    EKG:  EKG is ordered today. The ekg ordered today demonstrates sinus rhythm, rate 75, axis within normal limits intervals within normal limits, no acute ST-T wave changes.   Recent Labs: No results found for requested labs within last 8760 hours.    Lipid Panel No results found for: CHOL, TRIG, HDL, CHOLHDL, VLDL, LDLCALC, LDLDIRECT    Wt Readings from Last 3 Encounters:  01/30/21 238 lb (108  kg)  06/29/20 235 lb (106.6 kg)  01/28/20 241 lb (109.3 kg)      Other studies Reviewed: Additional studies/ records that were reviewed today include: Care Everywhere. Review of the above records demonstrates:  Please see elsewhere in the note.     ASSESSMENT AND PLAN:  NON OBSTRUCTIVE CAD:   We had a very long discussion about primary risk reduction.  I do not think that further cardiovascular testing is suggested.  HTN: Her blood pressure is controlled.  No change in therapy.  DYSLIPIDEMIA: Her LDL earlier this year was 67 with an HDL of 58.  I think her ratio is reasonable.  I do not see a benefit to the niacin over-the-counter that he is taking.  We had a very long discussion about exercise and diet with emphasis on starting to get closer to a Mediterranean/plant-based diet.  We talked about goals, slowly and exercise regimen.  MORBID OBESITY: We talked about goals of therapy for slow and gradual weight loss with lifestyle changes.   Current medicines are reviewed at length with the patient today.  The patient does not have concerns regarding medicines.  The following changes have been made:  as above   Labs/ tests ordered today include: None  Orders Placed This Encounter  Procedures   EKG 12-Lead      Disposition:   FU with in one year.     Signed, Minus Breeding, MD  01/30/2021 12:08 PM    [facility]

## 2021-01-30 ENCOUNTER — Encounter: Payer: Self-pay | Admitting: Cardiology

## 2021-01-30 ENCOUNTER — Encounter: Payer: Self-pay | Admitting: Neurology

## 2021-01-30 ENCOUNTER — Other Ambulatory Visit: Payer: Self-pay

## 2021-01-30 ENCOUNTER — Ambulatory Visit (INDEPENDENT_AMBULATORY_CARE_PROVIDER_SITE_OTHER): Payer: BC Managed Care – PPO | Admitting: Cardiology

## 2021-01-30 ENCOUNTER — Ambulatory Visit (INDEPENDENT_AMBULATORY_CARE_PROVIDER_SITE_OTHER): Payer: BC Managed Care – PPO | Admitting: Neurology

## 2021-01-30 VITALS — BP 128/84 | HR 75 | Ht 63.0 in | Wt 238.0 lb

## 2021-01-30 VITALS — BP 153/98 | HR 83 | Ht 62.0 in | Wt 239.5 lb

## 2021-01-30 DIAGNOSIS — G43009 Migraine without aura, not intractable, without status migrainosus: Secondary | ICD-10-CM

## 2021-01-30 DIAGNOSIS — R931 Abnormal findings on diagnostic imaging of heart and coronary circulation: Secondary | ICD-10-CM | POA: Diagnosis not present

## 2021-01-30 DIAGNOSIS — I10 Essential (primary) hypertension: Secondary | ICD-10-CM

## 2021-01-30 DIAGNOSIS — G4700 Insomnia, unspecified: Secondary | ICD-10-CM

## 2021-01-30 DIAGNOSIS — G4733 Obstructive sleep apnea (adult) (pediatric): Secondary | ICD-10-CM

## 2021-01-30 DIAGNOSIS — Z9989 Dependence on other enabling machines and devices: Secondary | ICD-10-CM

## 2021-01-30 DIAGNOSIS — G2581 Restless legs syndrome: Secondary | ICD-10-CM

## 2021-01-30 NOTE — Patient Instructions (Signed)

## 2021-01-30 NOTE — Progress Notes (Signed)
x

## 2021-01-30 NOTE — Progress Notes (Signed)
[facility]  PATIENT: Joan West DOB: 1965/11/24  REFERRING DOCTOR OR PCP:  Vedia Coffer SOURCE: Patient, from [facility], sleep study reports, sleep study data, download data  _________________________________   HISTORICAL  CHIEF COMPLAINT:  Chief Complaint  Patient presents with   Follow-up    RM 12, alone. Last seen 06/29/2020. OSA/CPAP. No issues. Using nightly, about 7-8 hours each night.     HISTORY OF PRESENT ILLNESS:  Joan West is a 55 y.o. woman with migraines, sleep apnea, insomnia and restless leg syndrome    Update 01/30/2021: She has OSA diagnosed in 2000.   HST 03/2020 showed severe OSA (AHI = 46).   Auto-PAP 5-10 was initiated.    She is tolerating it well.  Her download showed 100% compliance averaging 8 hours 2 minutes with good efficacy (AHI = 3.5)     She tolerates it well.   She has no EDS on CPAP.     She feels refreshed in the mornings.      She has insomnia.   On Lunesta plus Seroquel, insomnia is better.   She is on gabapentin for RLS.  With that combination, sleep latency is short and sh does not wake up most nights.  On nights she wakes up earlier, she may take a second quetiapine.  RLS has been better the last year.   Iron has been fine so no recent infusion.    She has migraine headaches.   She averages 1-2 migraines / month but currently has one.   She was in the sun a lot yesterday.  This is unusual.    She does well on rizatriptan and rarely takes a sumatriptan if the rizatriptan was ineffective (not as well tolerated with HTN).      OSA history: Joan West was diagnosed with OSA in 2000 and started on CPAP. A PSG in 2005 showed that she had OSA with AHI equals 26 and CPAP pressure was increased from +10 cm to a pressure to +10 cm. Of note, her weight gain 30 pounds between 2000 and 2005. Again and another 40 pounds over the next 5 years. She had gastric bypass surgery in 2010 and lost 40 pounds and continued on  CPAP +10.  She was re-titrated in 2010 and was placed on CPAP +11 cm and received a new machine.   I reviewed compliance data from 2013. She had 93% compliance and excellent efficacy with an AHI equals 3.9 on CPAP +11 cm.    Current weight is 223, up slightly compared to last time I saw her in 2015.     Home sleep study 04/08/2020 showed severe OSA with AHI = 46.  She was placed on AutoPap 5-20 cm.    REVIEW OF SYSTEMS: Constitutional: No fevers, chills, sweats, or change in appetite Eyes: No visual changes, double vision, eye pain Ear, nose and throat: No hearing loss, ear pain, nasal congestion, sore throat Cardiovascular: No chest pain, palpitations Respiratory:  No shortness of breath at rest or with exertion.   No wheezes.  Has OSA GastrointestinaI: No nausea, vomiting, diarrhea, abdominal pain, fecal incontinence Genitourinary:  No dysuria, urinary retention or frequency.  No nocturia. Musculoskeletal:  No neck pain, back pain Integumentary: No rash, pruritus, skin lesions Neurological: as above Psychiatric: No depression at this time.  No anxiety Endocrine: No palpitations, diaphoresis, change in appetite, change in weigh or increased thirst Hematologic/Lymphatic:  No anemia now but had in past due to low iron.   No  purpura, petechiae. Allergic/Immunologic: No itchy/runny eyes, nasal congestion, recent allergic reactions, rashes  ALLERGIES: Allergies  Allergen Reactions   Sulfa Antibiotics Rash    fever    HOME MEDICATIONS:  Current Outpatient Medications:    amLODipine (NORVASC) 5 MG tablet, Take 1 tablet by mouth daily., Disp: , Rfl:    Cholecalciferol (VITAMIN D PO), Take 1 tablet by mouth daily., Disp: , Rfl:    Cyanocobalamin (B-12 PO), Take 1 tablet by mouth daily., Disp: , Rfl:    Eszopiclone 3 MG TABS, Take 1 tablet (3 mg total) by mouth at bedtime., Disp: 30 tablet, Rfl: 5   gabapentin (NEURONTIN) 300 MG capsule, Take 1-2 tabs po QHS, Disp: 180 capsule, Rfl: 1    losartan (COZAAR) 50 MG tablet, Take 50 mg by mouth 2 (two) times daily. , Disp: , Rfl:    Multiple Vitamin (MULTIVITAMIN) tablet, Take 1 tablet by mouth daily. Centrum Silver daily, Disp: , Rfl:    Niacin (ENDUR-ACIN PO), Take 500 mg by mouth daily., Disp: , Rfl:    Nutritional Supplements (JUICE PLUS FIBRE PO), Take 1 tablet by mouth daily., Disp: , Rfl:    Omega-3 Fatty Acids (FISH OIL) 1000 MG CAPS, Take by mouth., Disp: , Rfl:    QUEtiapine (SEROQUEL) 25 MG tablet, Take 1-2 tabs po prn middle of the night awakening, Disp: 180 tablet, Rfl: 1   QUEtiapine (SEROQUEL) 50 MG tablet, TAKE 1-2 TABLETS BY MOUTH AT BEDTIME., Disp: 180 tablet, Rfl: 1   rizatriptan (MAXALT-MLT) 10 MG disintegrating tablet, Take by mouth., Disp: , Rfl:    rosuvastatin (CRESTOR) 20 MG tablet, TAKE ONE TABLET (20 MG DOSE) BY MOUTH DAILY., Disp: , Rfl:    sertraline (ZOLOFT) 100 MG tablet, Take 2 tablets (200 mg total) by mouth daily., Disp: 180 tablet, Rfl: 2   SUMAtriptan (IMITREX) 100 MG tablet, Take 1 tablet (100 mg total) by mouth once as needed for up to 1 dose for migraine. May repeat in 2 hours if headache persists or recurs., Disp: 10 tablet, Rfl: 2  PAST MEDICAL HISTORY: Past Medical History:  Diagnosis Date   Anemia    has received iron infusions   Anxiety    Arthritis    mid foot arthristis   Cancer ([place])    Carpal tunnel syndrome    Headache    Hyperlipidemia    Hypertension    Insomnia    Migraine    Obstructive sleep apnea    PONV (postoperative nausea and vomiting)    Skin cancer    Skin cancer, basal cell    Sleep apnea    wears cpap   Vision loss of left eye    history of lazy eye    PAST SURGICAL HISTORY: Past Surgical History:  Procedure Laterality Date   APPENDECTOMY     BILATERAL CARPAL TUNNEL RELEASE     CARDIAC CATHETERIZATION     CESAREAN SECTION     x2   DILITATION & CURRETTAGE/HYSTROSCOPY WITH NOVASURE ABLATION N/A 04/03/2016   Procedure: DILATATION &  CURETTAGE/HYSTEROSCOPY WITH NOVASURE ABLATION;  Surgeon: Brien Few, MD;  Location: [facility];  Service: Gynecology;  Laterality: N/A;   GASTRIC BYPASS     skin cancer     SKIN CANCER EXCISION      FAMILY HISTORY: Family History  Problem Relation Age of Onset   Lung disease Mother    Schizophrenia Mother    Drug abuse Mother    Heart attack Father    Heart disease Father  Drug abuse Father    Bipolar disorder Father    Heart attack Brother    Anxiety disorder Son    Colon polyps Neg Hx    Colon cancer Neg Hx    Esophageal cancer Neg Hx    Rectal cancer Neg Hx    Stomach cancer Neg Hx     SOCIAL HISTORY:  Social History   Socioeconomic History   Marital status: Married    Spouse name: Not on file   Number of children: Not on file   Years of education: Not on file   Highest education level: Not on file  Occupational History   Not on file  Tobacco Use   Smoking status: Never   Smokeless tobacco: Never  Vaping Use   Vaping Use: Never used  Substance and Sexual Activity   Alcohol use: Yes    Alcohol/week: 1.0 standard drink    Types: 1 Glasses of wine per week    Comment: wine rarely   Drug use: No   Sexual activity: Not on file  Other Topics Concern   Not on file  Social History Narrative   Not on file   Social Determinants of Health   Financial Resource Strain: Not on file  Food Insecurity: Not on file  Transportation Needs: Not on file  Physical Activity: Not on file  Stress: Not on file  Social Connections: Not on file  Intimate Partner Violence: Not on file     PHYSICAL EXAM  Vitals:   01/30/21 1317  BP: (!) 153/98  Pulse: 83  Weight: 239 lb 8 oz (108.6 kg)  Height: 5\' 2"  (1.575 m)    Body mass index is 43.81 kg/m.   General: The patient is well-developed and well-nourished and in no acute distress  HEENT: Head is normocephalic and atraumatic. Pharynx is nonerythematous.  The pharynx is Mallampati 2.  The neck is supple    Neurologic Exam  Mental status: The patient is alert and oriented x 3 at the time of the examination. The patient has apparent normal recent and remote memory, with an apparently normal attention span and concentration ability.   Speech is normal.  Cranial nerves: Extraocular movements are full.  Facial strength and sensation is normal.  Trapezius strength is normal..  The tongue is midline, and the patient has symmetric elevation of the soft palate. No obvious hearing deficits are noted.  Motor: Muscle bulk is normal.  Muscle tone is normal.  Strength is 5/5.  Sensory: She has intact sensation to touch in the arms and legs..  Coordination: Finger-nose-finger and heel-to-shin is performed well.  Gait and station: Station is normal.   Gait and tandem gait are normal.  Romberg is negative.  Reflexes: Deep tendon reflexes are symmetric and normal bilaterally.    Marland Kitchen    DIAGNOSTIC DATA (LABS, IMAGING, TESTING) - I reviewed patient records, labs, notes, testing and imaging myself where available.  Lab Results  Component Value Date   WBC 6.8 03/29/2016   HGB 11.9 (L) 03/29/2016   HCT 35.0 (L) 03/29/2016   MCV 91.1 03/29/2016   PLT 261 03/29/2016      Component Value Date/Time   NA 134 (L) 03/29/2016 1135   K 3.5 03/29/2016 1135   CL 104 03/29/2016 1135   CO2 25 03/29/2016 1135   GLUCOSE 85 03/29/2016 1135   BUN 16 03/29/2016 1135   CREATININE 0.87 03/29/2016 1135   CALCIUM 9.2 03/29/2016 1135   GFRNONAA >60 03/29/2016 1135  GFRAA >60 03/29/2016 1135       ASSESSMENT AND PLAN  OSA on CPAP  Common migraine without intractability  Insomnia, unspecified type  Restless leg syndrome  1.   Use CPAP nightly.  We will continue the current settings.  Download shows excellent compliance and efficacy..   2.   Continue Maxalt for migraine.   Can have Imitrex 2 hours later if headache is not better (does not tolerate it as well) 3.   She will return to see me in 12 months or  sooner if there are new or worsening neurologic symptoms.   Brisia Schuermann A. Felecia Shelling, MD, PhD 8/62/8241, 7:53 PM Certified in Neurology, Clinical Neurophysiology, Sleep Medicine, Pain Medicine and Neuroimaging  [facility] [address], [place], [place] [address] [phone]

## 2021-02-04 IMAGING — CT CT HEAD W/O CM
3 series · 15 of 47 positions shown, 18 images · non-contrast
Comparison: None.

CLINICAL DATA: 54-year-old female with acute headache starting 2
days ago.

EXAM:
CT HEAD WITHOUT CONTRAST
TECHNIQUE: Contiguous axial images were obtained from the base of the skull
through the vertex without intravenous contrast.

[Series 2: head wo · axial · 0.45mm/px · z∈[+1068,+1192]mm · 9 of 30 slices shown, 12 images]
[im 3/30  brain]
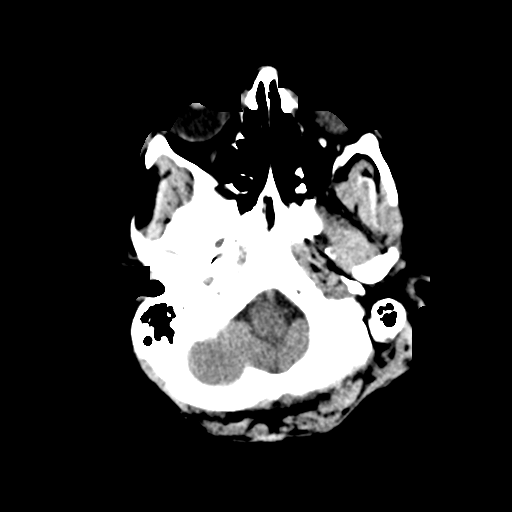
[im 3/30  bone]
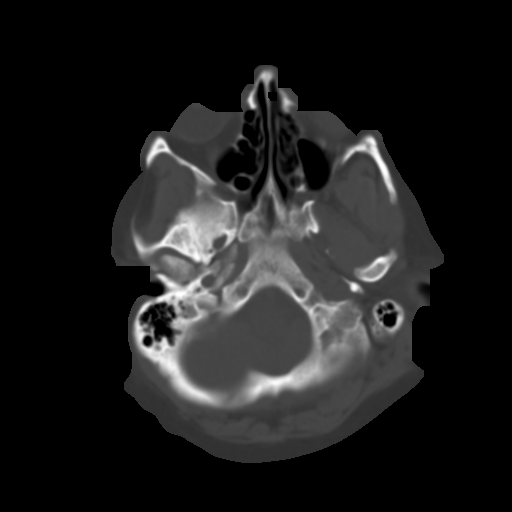
[im 6/30  brain]
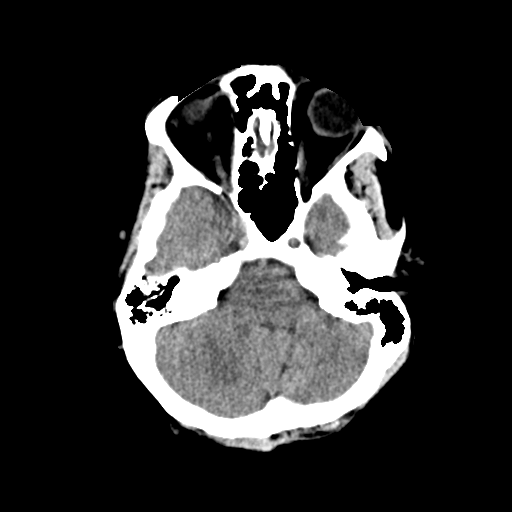
[im 9/30  brain]
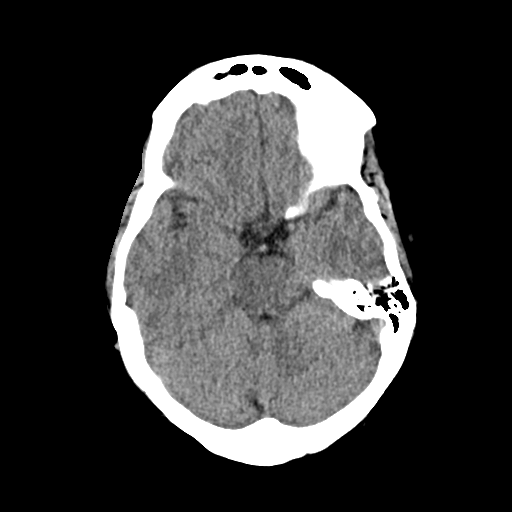
[im 12/30  brain]
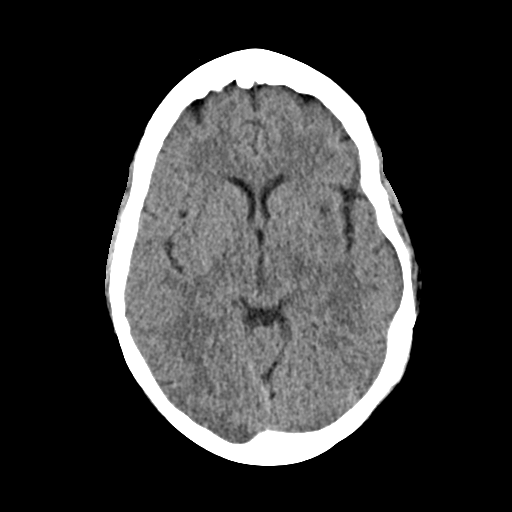
[im 16/30  brain]
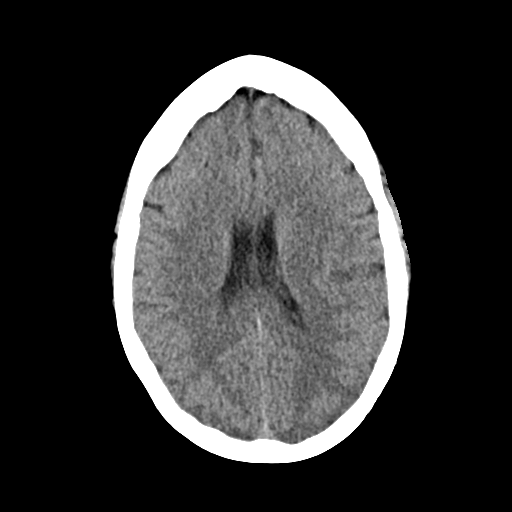
[im 16/30  bone]
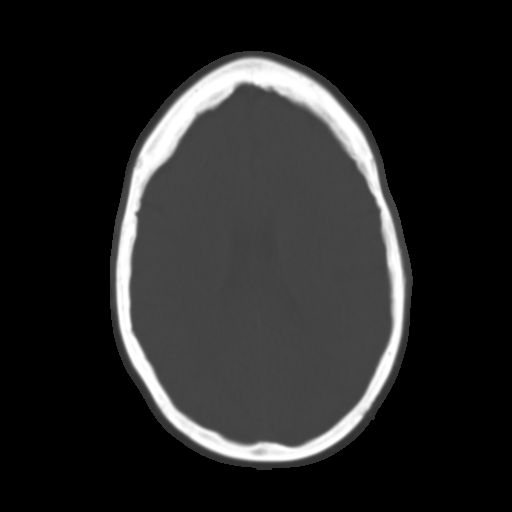
[im 19/30  brain]
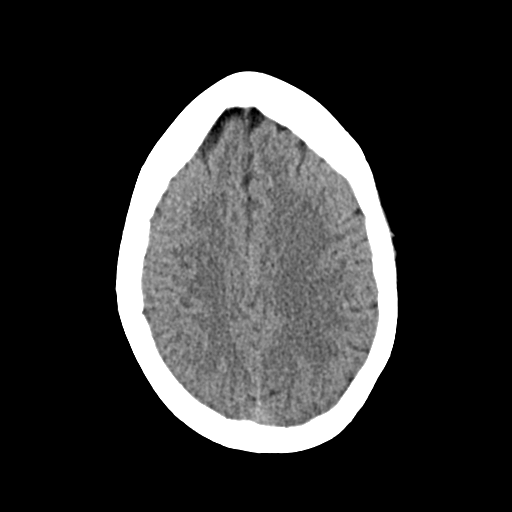
[im 22/30  brain]
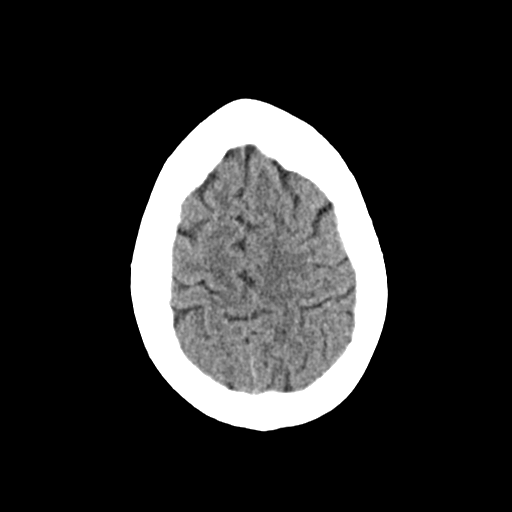
[im 25/30  brain]
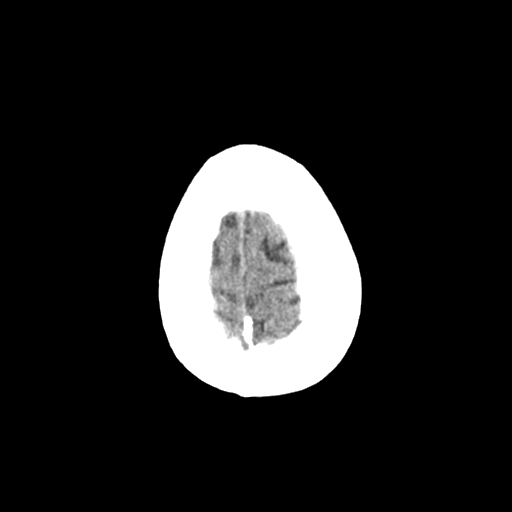
[im 28/30  brain]
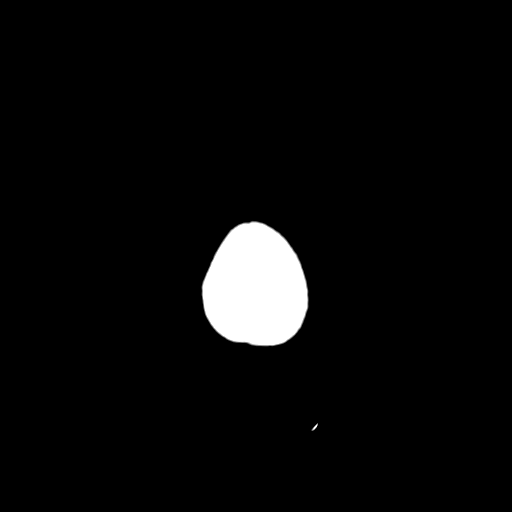
[im 28/30  bone]
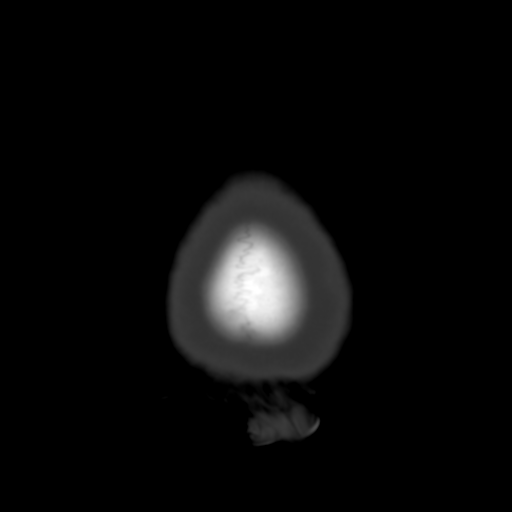

[Series 4: coronal soft · coronal · 0.30mm/px · 3 of 73 slices shown]
[im 25/73  brain]
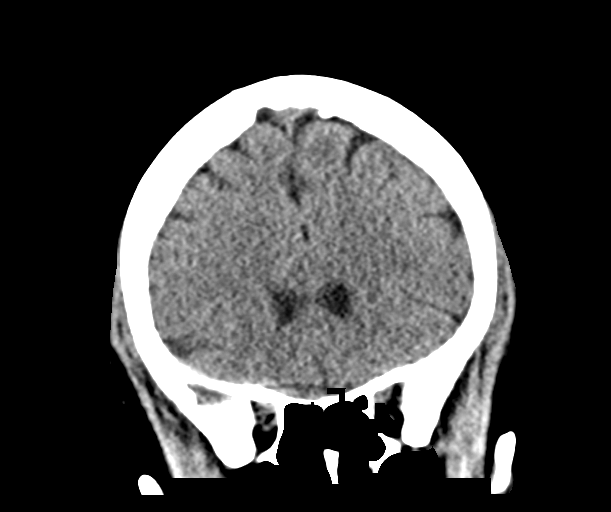
[im 33/73  brain]
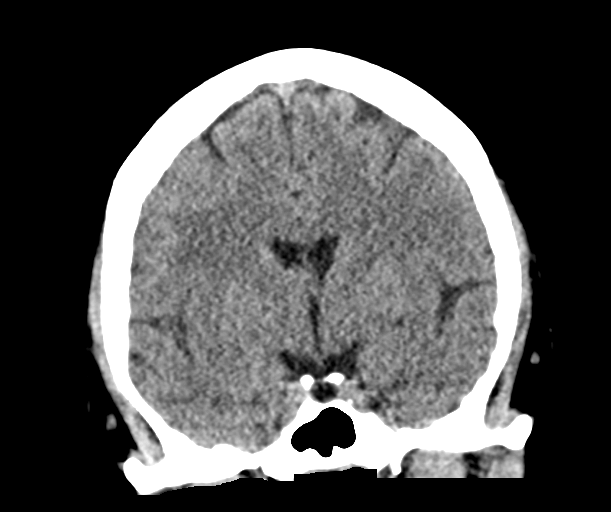
[im 41/73  brain]
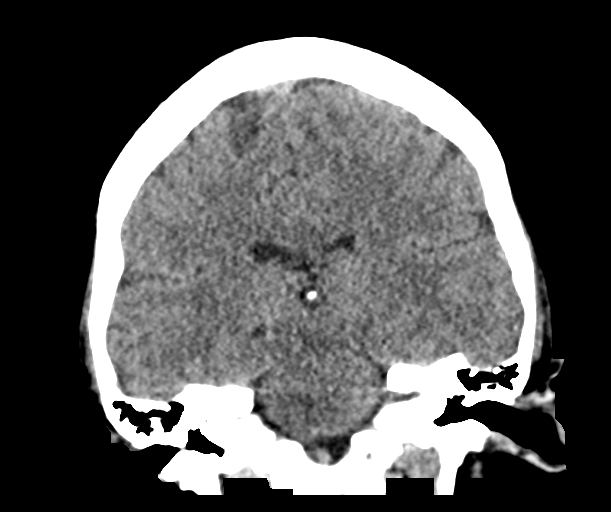

[Series 5: sag soft · sagittal · 0.28mm/px · 3 of 59 slices shown]
[im 20/59  brain]
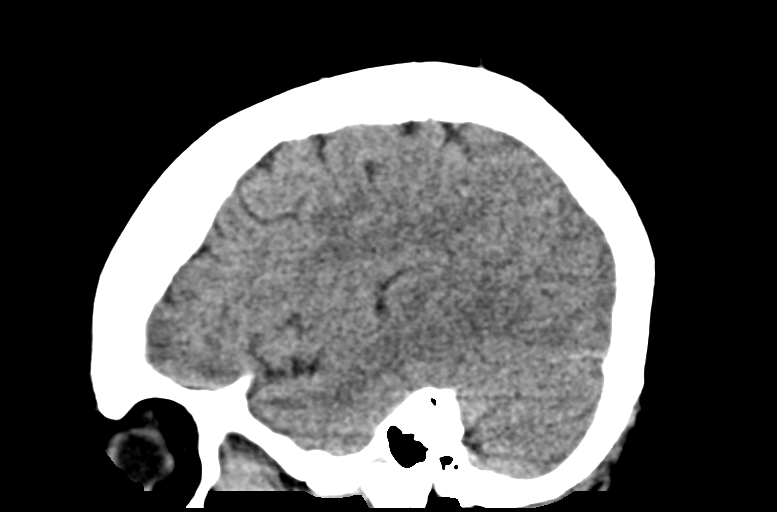
[im 30/59  brain]
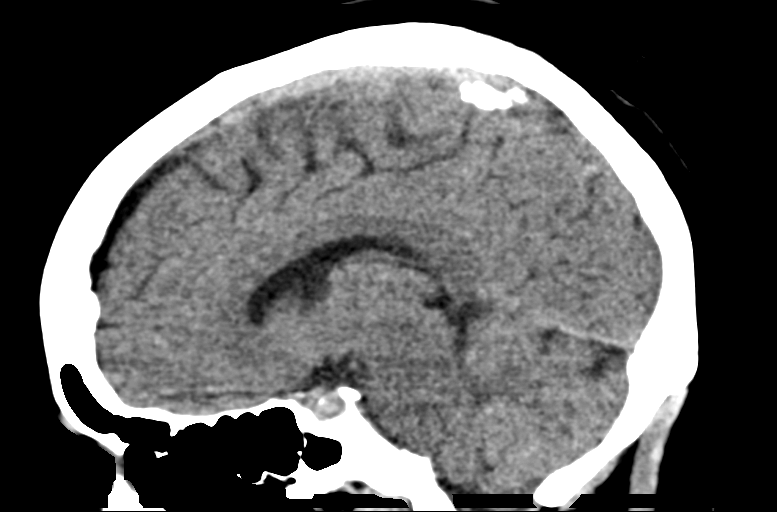
[im 39/59  brain]
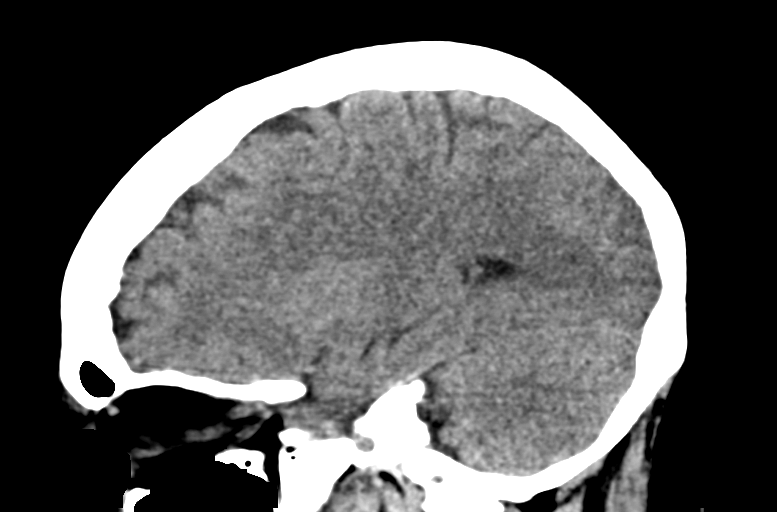

[15 of 47 positions shown; findings below may reference images not displayed]

FINDINGS: Brain: Normal cerebral volume. No midline shift, ventriculomegaly,
mass effect, evidence of mass lesion, intracranial hemorrhage or
evidence of cortically based acute infarction. Gray-white matter
differentiation is within normal limits throughout the brain.

Vascular: No suspicious intracranial vascular hyperdensity.

Skull: Negative.

Sinuses/Orbits: Visualized paranasal sinuses and mastoids are clear.

Other: Visualized orbits and scalp soft tissues are within normal
limits.
IMPRESSION: Normal noncontrast head CT.

## 2021-02-06 ENCOUNTER — Ambulatory Visit (INDEPENDENT_AMBULATORY_CARE_PROVIDER_SITE_OTHER): Payer: BC Managed Care – PPO | Admitting: Psychiatry

## 2021-02-06 ENCOUNTER — Encounter: Payer: Self-pay | Admitting: Psychiatry

## 2021-02-06 ENCOUNTER — Other Ambulatory Visit: Payer: Self-pay

## 2021-02-06 DIAGNOSIS — F411 Generalized anxiety disorder: Secondary | ICD-10-CM | POA: Diagnosis not present

## 2021-02-06 DIAGNOSIS — F5101 Primary insomnia: Secondary | ICD-10-CM | POA: Diagnosis not present

## 2021-02-06 DIAGNOSIS — F32A Depression, unspecified: Secondary | ICD-10-CM

## 2021-02-06 DIAGNOSIS — G4700 Insomnia, unspecified: Secondary | ICD-10-CM

## 2021-02-06 MED ORDER — GABAPENTIN 300 MG PO CAPS: ORAL_CAPSULE | ORAL | 1 refills | Status: DC

## 2021-02-06 MED ORDER — QUETIAPINE FUMARATE 25 MG PO TABS: ORAL_TABLET | ORAL | 1 refills | Status: DC

## 2021-02-06 MED ORDER — ESZOPICLONE 3 MG PO TABS: 3.0000 mg | ORAL_TABLET | Freq: Every day | ORAL | 5 refills | Status: DC

## 2021-02-06 MED ORDER — SERTRALINE HCL 100 MG PO TABS: 200.0000 mg | ORAL_TABLET | Freq: Every day | ORAL | 2 refills | Status: DC

## 2021-02-06 MED ORDER — QUETIAPINE FUMARATE 50 MG PO TABS: 50.0000 mg | ORAL_TABLET | Freq: Every day | ORAL | 1 refills | Status: DC

## 2021-02-06 MED ORDER — BUPROPION HCL ER (XL) 150 MG PO TB24: 150.0000 mg | ORAL_TABLET | Freq: Every day | ORAL | 2 refills | Status: DC

## 2021-02-06 NOTE — Progress Notes (Signed)
Joan West [id number] 1965-09-13 55 y.o.  Subjective:   Patient ID:  Joan West is a 55 y.o. (DOB February 05, 1966) female.  Chief Complaint:  Chief Complaint  Patient presents with   Anxiety   Depression   Sleeping Problem     HPI Joan West presents to the office today for follow-up of anxiety, depression, and insomnia. She reports that she has been sleeping well with occasional middle of the night awakenings (about 2-3 nights a week) and will eat at night. Denies binge eating. She has to take an additional Quetiapine to fall back to sleep. She reports that this sleep pattern is new. She reports that she falls asleep without difficulty. Wears cPap. Rarely naps and will have bad dreams when she naps.   She reports that she was "upset" after her brother and half-brother showed up unannounced at another family member's house and made to feel unwelcome. She questions "maybe it's my fault" about several recent difficult interactions and questions if she could have done something differently. She reports that she has been experiencing increased sadness. Denies persistent sadness. "I feel less able to deal with things."  She reports periods of tearfulness. She reports, "I feel a heaviness on my heart." She reports that her energy and motivation have been ok. She reports worry and rumination. Denies panic. Concentration has been ok. Denies anhedonia. Appetite has been ok. Denies. SI.   Daughter is having surgery on Wednesday after seeing pediatrician, then geneticist, and then a surgeon. Daughter will be 74 yo in July. Son has Aspergers.   Past medication trials: Cymbalta-helpful for depression but not anxiety Sertraline-effective for mood and anxiety. Thinks it may cause intrusive Citalopram Seroquel Trazodone-ineffective Melatonin-ineffective Benadryl-ineffective for insomnia Ambien-parasomnias Belsomra- unsure of benefit.  Trileptal-ineffective for insomnia or  anxiety Gabapentin [place] Office Visit from 08/08/2020 in [place] Office Visit from 11/24/2019 in [facility]  AIMS Total Score 1 0        Review of Systems:  Review of Systems  Gastrointestinal: Negative.   Musculoskeletal:  Negative for gait problem.       Trigger finger  Neurological:  Negative for tremors and headaches.  Psychiatric/Behavioral:         Please refer to HPI   RLS has been controlled  Medications: I have reviewed the patient's current medications.  Current Outpatient Medications  Medication Sig Dispense Refill   amLODipine (NORVASC) 5 MG tablet Take 1 tablet by mouth daily.     buPROPion (WELLBUTRIN XL) 150 MG 24 hr tablet Take 1 tablet (150 mg total) by mouth daily. 30 tablet 2   Cholecalciferol (VITAMIN D PO) Take 1 tablet by mouth daily.     Cyanocobalamin (B-12 PO) Take 1 tablet by mouth daily.     losartan (COZAAR) 100 MG tablet Take 100 mg by mouth daily.     Multiple Vitamin (MULTIVITAMIN) tablet Take 1 tablet by mouth daily. Centrum Silver daily     Nutritional Supplements (JUICE PLUS FIBRE PO) Take 1 tablet by mouth daily.     Omega-3 Fatty Acids (FISH OIL) 1000 MG CAPS Take by mouth.     rizatriptan (MAXALT-MLT) 10 MG disintegrating tablet Take by mouth.     rosuvastatin (CRESTOR) 20 MG tablet TAKE ONE TABLET (20 MG DOSE) BY MOUTH DAILY.     [START ON 02/26/2021] Eszopiclone 3 MG TABS Take 1 tablet (3 mg total) by mouth at bedtime. 30 tablet  5   gabapentin (NEURONTIN) 300 MG capsule Take 1-2 tabs po QHS 180 capsule 1   QUEtiapine (SEROQUEL) 25 MG tablet Take 1-2 tabs po prn middle of the night awakening 180 tablet 1   QUEtiapine (SEROQUEL) 50 MG tablet Take 1-2 tablets (50-100 mg total) by mouth at bedtime. 180 tablet 1   sertraline (ZOLOFT) 100 MG tablet Take 2 tablets (200 mg total) by mouth daily. 180 tablet 2   SUMAtriptan (IMITREX) 100 MG tablet Take 1 tablet (100 mg total) by  mouth once as needed for up to 1 dose for migraine. May repeat in 2 hours if headache persists or recurs. 10 tablet 2   No current facility-administered medications for this visit.    Medication Side Effects: None  Allergies:  Allergies  Allergen Reactions   Sulfa Antibiotics Rash    fever    Past Medical History:  Diagnosis Date   Anemia    has received iron infusions   Anxiety    Arthritis    mid foot arthristis   Cancer ([place])    Carpal tunnel syndrome    Headache    Hyperlipidemia    Hypertension    Insomnia    Migraine    Obstructive sleep apnea    PONV (postoperative nausea and vomiting)    Skin cancer    Skin cancer, basal cell    Sleep apnea    wears cpap   Vision loss of left eye    history of lazy eye    Past Medical History, Surgical history, Social history, and Family history were reviewed and updated as appropriate.   Please see review of systems for further details on the patient's review from today.   Objective:   Physical Exam:  There were no vitals taken for this visit.  Physical Exam Constitutional:      General: She is not in acute distress. Musculoskeletal:        General: No deformity.  Neurological:     Mental Status: She is alert and oriented to person, place, and time.     Coordination: Coordination normal.  Psychiatric:        Attention and Perception: Attention and perception normal. She does not perceive auditory or visual hallucinations.        Mood and Affect: Mood is anxious. Affect is not labile, blunt, angry or inappropriate.        Speech: Speech normal.        Behavior: Behavior normal.        Thought Content: Thought content normal. Thought content is not paranoid or delusional. Thought content does not include homicidal or suicidal ideation. Thought content does not include homicidal or suicidal plan.        Cognition and Memory: Cognition and memory normal.        Judgment: Judgment normal.     Comments: Insight  intact Mood presents as mildly depressed    Lab Review:     Component Value Date/Time   NA 134 (L) 03/29/2016 1135   K 3.5 03/29/2016 1135   CL 104 03/29/2016 1135   CO2 25 03/29/2016 1135   GLUCOSE 85 03/29/2016 1135   BUN 16 03/29/2016 1135   CREATININE 0.87 03/29/2016 1135   CALCIUM 9.2 03/29/2016 1135   GFRNONAA >60 03/29/2016 1135   GFRAA >60 03/29/2016 1135       Component Value Date/Time   WBC 6.8 03/29/2016 1135   RBC 3.84 (L) 03/29/2016 1135   HGB 11.9 (L)  03/29/2016 1135   HCT 35.0 (L) 03/29/2016 1135   PLT 261 03/29/2016 1135   MCV 91.1 03/29/2016 1135   MCH 31.0 03/29/2016 1135   MCHC 34.0 03/29/2016 1135   RDW 13.3 03/29/2016 1135   LYMPHSABS 3.0 03/15/2014 0540   MONOABS 0.6 03/15/2014 0540   EOSABS 0.2 03/15/2014 0540   BASOSABS 0.0 03/15/2014 0540    No results found for: POCLITH, LITHIUM   No results found for: PHENYTOIN, PHENOBARB, VALPROATE, CBMZ   .res Assessment: Plan:    Patient seen for 30 minutes and time spent counseling the patient regarding recent mood and anxiety signs and symptoms.  Discussed that symptoms may be exacerbated by recent psychosocial stressors and encouraged her to consider seeing therapist.  Discussed possible medication options to include potential benefits, risks, and side effects of Wellbutrin for augmentation of depression and BuSpar for augmentation of anxiety.  Patient reports that she would like to start trial of Wellbutrin XL.  We will start Wellbutrin XL 150 mg daily for depression. Continue sertraline 200 mg daily for anxiety and depression. Continue Lunesta 3 mg at bedtime for insomnia. Continue Seroquel 50 mg 1 to 2 tablets at bedtime for insomnia. Continue Seroquel 25 mg 1 to 2 tablets as needed for middle of the night awakening. Patient to follow-up in approximately 6 weeks or sooner if clinically indicated. Patient advised to contact office with any questions, adverse effects, or acute worsening in signs and  symptoms.   Joan West was seen today for anxiety, depression and sleeping problem.  Diagnoses and all orders for this visit:  Insomnia, unspecified type -     Eszopiclone 3 MG TABS; Take 1 tablet (3 mg total) by mouth at bedtime. -     gabapentin (NEURONTIN) 300 MG capsule; Take 1-2 tabs po QHS  Primary insomnia -     QUEtiapine (SEROQUEL) 25 MG tablet; Take 1-2 tabs po prn middle of the night awakening -     QUEtiapine (SEROQUEL) 50 MG tablet; Take 1-2 tablets (50-100 mg total) by mouth at bedtime.  Generalized anxiety disorder -     sertraline (ZOLOFT) 100 MG tablet; Take 2 tablets (200 mg total) by mouth daily.  Depression, unspecified depression type -     buPROPion (WELLBUTRIN XL) 150 MG 24 hr tablet; Take 1 tablet (150 mg total) by mouth daily. -     sertraline (ZOLOFT) 100 MG tablet; Take 2 tablets (200 mg total) by mouth daily.    Please see After Visit Summary for patient specific instructions.  Future Appointments  Date Time Provider [place]  03/20/2021 11:00 AM Thayer Headings, PMHNP [facility] None  01/30/2022  1:00 PM Lomax, Amy, NP [facility] None    No orders of the defined types were placed in this encounter.   -------------------------------

## 2021-02-28 ENCOUNTER — Other Ambulatory Visit: Payer: Self-pay | Admitting: Psychiatry

## 2021-02-28 DIAGNOSIS — F32A Depression, unspecified: Secondary | ICD-10-CM

## 2021-03-16 MED ORDER — AMLODIPINE BESYLATE 5 MG PO TABS: 5.0000 mg | ORAL_TABLET | Freq: Every day | ORAL | 11 refills | Status: DC

## 2021-03-16 MED ORDER — LOSARTAN POTASSIUM 100 MG PO TABS: 100.0000 mg | ORAL_TABLET | Freq: Every day | ORAL | 11 refills | Status: DC

## 2021-03-20 ENCOUNTER — Other Ambulatory Visit: Payer: Self-pay

## 2021-03-20 ENCOUNTER — Ambulatory Visit (INDEPENDENT_AMBULATORY_CARE_PROVIDER_SITE_OTHER): Payer: BC Managed Care – PPO | Admitting: Psychiatry

## 2021-03-20 ENCOUNTER — Encounter: Payer: Self-pay | Admitting: Psychiatry

## 2021-03-20 DIAGNOSIS — F32A Depression, unspecified: Secondary | ICD-10-CM | POA: Diagnosis not present

## 2021-03-20 DIAGNOSIS — F411 Generalized anxiety disorder: Secondary | ICD-10-CM

## 2021-03-20 DIAGNOSIS — G4700 Insomnia, unspecified: Secondary | ICD-10-CM

## 2021-03-20 MED ORDER — BUPROPION HCL ER (XL) 150 MG PO TB24: 150.0000 mg | ORAL_TABLET | Freq: Every day | ORAL | 1 refills | Status: DC

## 2021-03-20 NOTE — Progress Notes (Signed)
Joan West [id number] 21-Sep-1965 55 y.o.  Subjective:   Patient ID:  Joan West is a 55 y.o. (DOB 1965/09/11) female.  Chief Complaint:  Chief Complaint  Patient presents with   Follow-up    Anxiety, depression, and insomnia    HPI Joan West presents to the office today for follow-up of anxiety, depression, and insomnia. She reports, "I think the Wellbutrin has helped me a lot." She reports that she has been "handling stuff better." Denies depressed mood. She reports some improvement in energy and motivation. "I wake up in the morning ready to go and get stuff done." She reports that if she does not get "freaked out if I don't get everything done." Anxiety has been less. Sleeping well. Reports few middle of the night awakenings. Appetite has been good. She reports that she had one episode of eating at night since last visit. Concentration has been adequate. She reports "I feel like I am handling it better" in terms of family stressors. Denies anhedonia.  Denies SI.   Daughter's surgery went well and she is healing. Will take a trip to [place] later this week. She will return to school 04/06/21- "and I don't mind going back."    Past medication trials: Cymbalta-helpful for depression but not anxiety Sertraline-effective for mood and anxiety. Thinks it may cause intrusive Citalopram Seroquel Trazodone-ineffective Melatonin-ineffective Benadryl-ineffective for insomnia Ambien-parasomnias Belsomra- unsure of benefit.  Trileptal-ineffective for insomnia or anxiety Gabapentin [place] Office Visit from 08/08/2020 in [place] Office Visit from 11/24/2019 in [facility]  AIMS Total Score 1 0        Review of Systems:  Review of Systems  Musculoskeletal:  Negative for gait problem.       Had trigger release surgery and continues to have stitches.   Neurological:  Negative for tremors and headaches.   Psychiatric/Behavioral:         Please refer to HPI  Saw cardiologist and had recent annual physical exam.   Medications: I have reviewed the patient's current medications.  Current Outpatient Medications  Medication Sig Dispense Refill   amLODipine (NORVASC) 5 MG tablet Take 1 tablet (5 mg total) by mouth daily. 30 tablet 11   Cholecalciferol (VITAMIN D PO) Take 1 tablet by mouth daily.     Cyanocobalamin (B-12 PO) Take 1 tablet by mouth daily.     Eszopiclone 3 MG TABS Take 1 tablet (3 mg total) by mouth at bedtime. 30 tablet 5   gabapentin (NEURONTIN) 300 MG capsule Take 1-2 tabs po QHS 180 capsule 1   losartan (COZAAR) 100 MG tablet Take 1 tablet (100 mg total) by mouth daily. 30 tablet 11   Multiple Vitamin (MULTIVITAMIN) tablet Take 1 tablet by mouth daily. Centrum Silver daily     Nutritional Supplements (JUICE PLUS FIBRE PO) Take 1 tablet by mouth daily.     Omega-3 Fatty Acids (FISH OIL) 1000 MG CAPS Take by mouth.     QUEtiapine (SEROQUEL) 25 MG tablet Take 1-2 tabs po prn middle of the night awakening 180 tablet 1   QUEtiapine (SEROQUEL) 50 MG tablet Take 1-2 tablets (50-100 mg total) by mouth at bedtime. 180 tablet 1   rizatriptan (MAXALT-MLT) 10 MG disintegrating tablet Take by mouth.     rosuvastatin (CRESTOR) 20 MG tablet TAKE ONE TABLET (20 MG DOSE) BY MOUTH DAILY.     sertraline (ZOLOFT) 100 MG tablet Take 2 tablets (200  mg total) by mouth daily. 180 tablet 2   buPROPion (WELLBUTRIN XL) 150 MG 24 hr tablet Take 1 tablet (150 mg total) by mouth daily. 90 tablet 1   SUMAtriptan (IMITREX) 100 MG tablet Take 1 tablet (100 mg total) by mouth once as needed for up to 1 dose for migraine. May repeat in 2 hours if headache persists or recurs. (Patient not taking: Reported on 03/20/2021) 10 tablet 2   No current facility-administered medications for this visit.    Medication Side Effects: None  Allergies:  Allergies  Allergen Reactions   Sulfa Antibiotics Rash    fever     Past Medical History:  Diagnosis Date   Anemia    has received iron infusions   Anxiety    Arthritis    mid foot arthristis   Cancer ([place])    Carpal tunnel syndrome    Headache    Hyperlipidemia    Hypertension    Insomnia    Migraine    Obstructive sleep apnea    PONV (postoperative nausea and vomiting)    Skin cancer    Skin cancer, basal cell    Sleep apnea    wears cpap   Vision loss of left eye    history of lazy eye    Past Medical History, Surgical history, Social history, and Family history were reviewed and updated as appropriate.   Please see review of systems for further details on the patient's review from today.   Objective:   Physical Exam:  There were no vitals taken for this visit.  Physical Exam Constitutional:      General: She is not in acute distress. Musculoskeletal:        General: No deformity.  Neurological:     Mental Status: She is alert and oriented to person, place, and time.     Coordination: Coordination normal.  Psychiatric:        Attention and Perception: Attention and perception normal. She does not perceive auditory or visual hallucinations.        Mood and Affect: Mood normal. Mood is not anxious or depressed. Affect is not labile, blunt, angry or inappropriate.        Speech: Speech normal.        Behavior: Behavior normal.        Thought Content: Thought content normal. Thought content is not paranoid or delusional. Thought content does not include homicidal or suicidal ideation. Thought content does not include homicidal or suicidal plan.        Cognition and Memory: Cognition and memory normal.        Judgment: Judgment normal.     Comments: Insight intact    Lab Review:     Component Value Date/Time   NA 134 (L) 03/29/2016 1135   K 3.5 03/29/2016 1135   CL 104 03/29/2016 1135   CO2 25 03/29/2016 1135   GLUCOSE 85 03/29/2016 1135   BUN 16 03/29/2016 1135   CREATININE 0.87 03/29/2016 1135   CALCIUM 9.2  03/29/2016 1135   GFRNONAA >60 03/29/2016 1135   GFRAA >60 03/29/2016 1135       Component Value Date/Time   WBC 6.8 03/29/2016 1135   RBC 3.84 (L) 03/29/2016 1135   HGB 11.9 (L) 03/29/2016 1135   HCT 35.0 (L) 03/29/2016 1135   PLT 261 03/29/2016 1135   MCV 91.1 03/29/2016 1135   MCH 31.0 03/29/2016 1135   MCHC 34.0 03/29/2016 1135   RDW 13.3 03/29/2016 1135  LYMPHSABS 3.0 03/15/2014 0540   MONOABS 0.6 03/15/2014 0540   EOSABS 0.2 03/15/2014 0540   BASOSABS 0.0 03/15/2014 0540    No results found for: POCLITH, LITHIUM   No results found for: PHENYTOIN, PHENOBARB, VALPROATE, CBMZ   .res Assessment: Plan:    Will continue current plan of care since target signs and symptoms are well controlled without any tolerability issues. Continue Wellbutrin XL 150 mg daily for depression. Continue sertraline 200 mg daily for anxiety and depression. Continue Lunesta 3 mg at bedtime for insomnia. Continue Seroquel 50 mg 1 to 2 tablets at bedtime for off label indications of insomnia. Continue Seroquel 25 mg 1-2 tabs as needed for middle of the night awakenings. Continue gabapentin 300 mg 1-2 tabs at bedtime for anxiety, RLS, and insomnia. Patient to follow-up in 4 to 6 months or sooner if clinically indicated. Patient advised to contact office with any questions, adverse effects, or acute worsening in signs and symptoms.   Lennyx was seen today for follow-up.  Diagnoses and all orders for this visit:  Depression, unspecified depression type -     buPROPion (WELLBUTRIN XL) 150 MG 24 hr tablet; Take 1 tablet (150 mg total) by mouth daily.  Generalized anxiety disorder  Insomnia, unspecified type    Please see After Visit Summary for patient specific instructions.  Future Appointments  Date Time Provider [place]  08/07/2021 11:30 AM Thayer Headings, PMHNP [facility] None  01/30/2022  1:00 PM Lomax, Amy, NP [facility] None    No orders of the defined types were placed in  this encounter.   -------------------------------

## 2021-06-28 ENCOUNTER — Ambulatory Visit: Payer: BC Managed Care – PPO | Admitting: Family Medicine

## 2021-08-02 ENCOUNTER — Other Ambulatory Visit: Payer: Self-pay | Admitting: Psychiatry

## 2021-08-02 DIAGNOSIS — F5101 Primary insomnia: Secondary | ICD-10-CM

## 2021-08-03 NOTE — Telephone Encounter (Signed)
Called patient and she said she doesn't need a refill right now and can wait to discuss with Janett Billow at her Monday appt.

## 2021-08-07 ENCOUNTER — Telehealth (INDEPENDENT_AMBULATORY_CARE_PROVIDER_SITE_OTHER): Payer: BC Managed Care – PPO | Admitting: Psychiatry

## 2021-08-07 ENCOUNTER — Encounter: Payer: Self-pay | Admitting: Psychiatry

## 2021-08-07 DIAGNOSIS — F411 Generalized anxiety disorder: Secondary | ICD-10-CM | POA: Diagnosis not present

## 2021-08-07 DIAGNOSIS — F32A Depression, unspecified: Secondary | ICD-10-CM

## 2021-08-07 DIAGNOSIS — F5101 Primary insomnia: Secondary | ICD-10-CM | POA: Diagnosis not present

## 2021-08-07 DIAGNOSIS — G4700 Insomnia, unspecified: Secondary | ICD-10-CM

## 2021-08-07 MED ORDER — GABAPENTIN 300 MG PO CAPS: ORAL_CAPSULE | ORAL | 1 refills | Status: DC

## 2021-08-07 MED ORDER — BUPROPION HCL ER (XL) 150 MG PO TB24: 150.0000 mg | ORAL_TABLET | Freq: Every day | ORAL | 1 refills | Status: DC

## 2021-08-07 MED ORDER — ESZOPICLONE 3 MG PO TABS: 3.0000 mg | ORAL_TABLET | Freq: Every day | ORAL | 5 refills | Status: DC

## 2021-08-07 MED ORDER — QUETIAPINE FUMARATE 50 MG PO TABS: 50.0000 mg | ORAL_TABLET | Freq: Every day | ORAL | 1 refills | Status: DC

## 2021-08-07 MED ORDER — SERTRALINE HCL 100 MG PO TABS: 200.0000 mg | ORAL_TABLET | Freq: Every day | ORAL | 2 refills | Status: DC

## 2021-08-07 MED ORDER — QUETIAPINE FUMARATE 25 MG PO TABS: ORAL_TABLET | ORAL | 1 refills | Status: DC

## 2021-08-07 NOTE — Progress Notes (Signed)
Joan West [id number] [birth year] 55 y.o.  Virtual Visit via Video Note  I connected with pt @ on 08/07/21 at 11:30 AM EST by a video enabled telemedicine application and verified that I am speaking with the correct person using two identifiers.   I discussed the limitations of evaluation and management by telemedicine and the availability of in person appointments. The patient expressed understanding and agreed to proceed.  I discussed the assessment and treatment plan with the patient. The patient was provided an opportunity to ask questions and all were answered. The patient agreed with the plan and demonstrated an understanding of the instructions.   The patient was advised to call back or seek an in-person evaluation if the symptoms worsen or if the condition fails to improve as anticipated.  I provided 25 minutes of non-face-to-face time during this encounter.  The patient was located at home.  The provider was located at [place].   Thayer Headings, PMHNP   Subjective:   Patient ID:  Joan West is a 55 y.o. (DOB 11/02/1965) female.  Chief Complaint:  Chief Complaint  Patient presents with   Follow-up    Anxiety, depression, and insomnia    HPI Joan West presents for follow-up of anxiety, depression, and insomnia. She is noticing increased fatigue, regardless of sleep qty. Reports that she has been compliant with cPap. She reports that her motivation is adequate. She is scheduled for re-check on iron levels. Denies depressed mood. She reports that she had some situational stress. She reports that her anxiety has been manageable. Denies panic. She reports falling asleep without difficulty. Rare early morning awakenings. Appetite has been ok and somewhat decreased. She reports adequate concentration. Denies SI.   Now on Christmas break. Brother was in the hospital for 3 weeks with an infection.   Past medication trials: Cymbalta-helpful for  depression but not anxiety Sertraline-effective for mood and anxiety. Thinks it may cause intrusive Citalopram Seroquel Trazodone-ineffective Melatonin-ineffective Benadryl-ineffective for insomnia Ambien-parasomnias Belsomra- unsure of benefit.  Trileptal-ineffective for insomnia or anxiety Gabapentin Lunesta   Review of Systems:  Review of Systems  Constitutional:  Positive for fatigue.  Musculoskeletal:  Negative for gait problem.  Neurological:  Negative for tremors.  Psychiatric/Behavioral:         Please refer to HPI  Rare migraines  Medications: I have reviewed the patient's current medications.  Current Outpatient Medications  Medication Sig Dispense Refill   amLODipine (NORVASC) 5 MG tablet Take 1 tablet (5 mg total) by mouth daily. 30 tablet 11   Cholecalciferol (VITAMIN D PO) Take 1 tablet by mouth daily.     Cyanocobalamin (B-12 PO) Take 1 tablet by mouth daily.     losartan (COZAAR) 100 MG tablet Take 1 tablet (100 mg total) by mouth daily. 30 tablet 11   Multiple Vitamin (MULTIVITAMIN) tablet Take 1 tablet by mouth daily. Centrum Silver daily     QUEtiapine (SEROQUEL) 50 MG tablet Take 1-2 tablets (50-100 mg total) by mouth at bedtime. 180 tablet 1   rizatriptan (MAXALT-MLT) 10 MG disintegrating tablet Take by mouth.     rosuvastatin (CRESTOR) 20 MG tablet TAKE ONE TABLET (20 MG DOSE) BY MOUTH DAILY.     buPROPion (WELLBUTRIN XL) 150 MG 24 hr tablet Take 1 tablet (150 mg total) by mouth daily. 90 tablet 1   [START ON 08/17/2021] Eszopiclone 3 MG TABS Take 1 tablet (3 mg total) by mouth at bedtime. 30 tablet 5   gabapentin (NEURONTIN) 300 MG  capsule Take 1-2 tabs po QHS 180 capsule 1   Omega-3 Fatty Acids (FISH OIL) 1000 MG CAPS Take by mouth. (Patient not taking: Reported on 08/07/2021)     QUEtiapine (SEROQUEL) 25 MG tablet Take 1-2 tabs po prn middle of the night awakening 180 tablet 1   sertraline (ZOLOFT) 100 MG tablet Take 2 tablets (200 mg total) by mouth  daily. 180 tablet 2   SUMAtriptan (IMITREX) 100 MG tablet Take 1 tablet (100 mg total) by mouth once as needed for up to 1 dose for migraine. May repeat in 2 hours if headache persists or recurs. (Patient not taking: Reported on 03/20/2021) 10 tablet 2   No current facility-administered medications for this visit.    Medication Side Effects: None  Allergies:  Allergies  Allergen Reactions   Sulfa Antibiotics Rash    fever    Past Medical History:  Diagnosis Date   Anemia    has received iron infusions   Anxiety    Arthritis    mid foot arthristis   Cancer ([place])    Carpal tunnel syndrome    Headache    Hyperlipidemia    Hypertension    Insomnia    Migraine    Obstructive sleep apnea    PONV (postoperative nausea and vomiting)    Skin cancer    Skin cancer, basal cell    Sleep apnea    wears cpap   Vision loss of left eye    history of lazy eye    Family History  Problem Relation Age of Onset   Lung disease Mother    Schizophrenia Mother    Drug abuse Mother    Heart attack Father    Heart disease Father    Drug abuse Father    Bipolar disorder Father    Heart attack Brother    Anxiety disorder Son    Colon polyps Neg Hx    Colon cancer Neg Hx    Esophageal cancer Neg Hx    Rectal cancer Neg Hx    Stomach cancer Neg Hx     Social History   Socioeconomic History   Marital status: Married    Spouse name: Not on file   Number of children: Not on file   Years of education: Not on file   Highest education level: Not on file  Occupational History   Not on file  Tobacco Use   Smoking status: Never   Smokeless tobacco: Never  Vaping Use   Vaping Use: Never used  Substance and Sexual Activity   Alcohol use: Yes    Alcohol/week: 1.0 standard drink    Types: 1 Glasses of wine per week    Comment: wine rarely   Drug use: No   Sexual activity: Not on file  Other Topics Concern   Not on file  Social History Narrative   Not on file   Social  Determinants of Health   Financial Resource Strain: Not on file  Food Insecurity: Not on file  Transportation Needs: Not on file  Physical Activity: Not on file  Stress: Not on file  Social Connections: Not on file  Intimate Partner Violence: Not on file    Past Medical History, Surgical history, Social history, and Family history were reviewed and updated as appropriate.   Please see review of systems for further details on the patient's review from today.   Objective:   Physical Exam:  There were no vitals taken for this visit.  Physical  Exam Constitutional:      General: She is not in acute distress. Musculoskeletal:        General: No deformity.  Neurological:     Mental Status: She is alert and oriented to person, place, and time.     Coordination: Coordination normal.  Psychiatric:        Attention and Perception: Attention and perception normal. She does not perceive auditory or visual hallucinations.        Mood and Affect: Mood normal. Mood is not anxious or depressed. Affect is not labile, blunt, angry or inappropriate.        Speech: Speech normal.        Behavior: Behavior normal.        Thought Content: Thought content normal. Thought content is not paranoid or delusional. Thought content does not include homicidal or suicidal ideation. Thought content does not include homicidal or suicidal plan.        Cognition and Memory: Cognition and memory normal.        Judgment: Judgment normal.     Comments: Insight intact    Lab Review:     Component Value Date/Time   NA 134 (L) 03/29/2016 1135   K 3.5 03/29/2016 1135   CL 104 03/29/2016 1135   CO2 25 03/29/2016 1135   GLUCOSE 85 03/29/2016 1135   BUN 16 03/29/2016 1135   CREATININE 0.87 03/29/2016 1135   CALCIUM 9.2 03/29/2016 1135   GFRNONAA >60 03/29/2016 1135   GFRAA >60 03/29/2016 1135       Component Value Date/Time   WBC 6.8 03/29/2016 1135   RBC 3.84 (L) 03/29/2016 1135   HGB 11.9 (L) 03/29/2016  1135   HCT 35.0 (L) 03/29/2016 1135   PLT 261 03/29/2016 1135   MCV 91.1 03/29/2016 1135   MCH 31.0 03/29/2016 1135   MCHC 34.0 03/29/2016 1135   RDW 13.3 03/29/2016 1135   LYMPHSABS 3.0 03/15/2014 0540   MONOABS 0.6 03/15/2014 0540   EOSABS 0.2 03/15/2014 0540   BASOSABS 0.0 03/15/2014 0540    No results found for: POCLITH, LITHIUM   No results found for: PHENYTOIN, PHENOBARB, VALPROATE, CBMZ   .res Assessment: Plan:    Pt seen for 25 minutes and time spent discussing recent fatigue. Discussed that fatigue seems to be related to underlying medical cause due to the absence of any other possible depressive s/s. Discussed checking iron profile due to history of anemia and also Vit D and Vit B12 levels due to risk of decreased absorption following weight loss surgery. She reports that she has visit with obgyn later today and will discuss obtaining these labs at time of visit. Discussed that this provider could also order labs if needed.  Discussed that sleep apnea can cause excessive fatigue and daytime somnolence even with consistent use of cPap and that there are treatment options for this. Discussed considering treatment if medical causes for fatigue are ruled out.  Discussed tardive dyskinesia and observations that she is periodically licking lips. Discussed risks, treatment, and considerations related to TD. She reports that neither she nor her family have noticed involuntary movements, however she will monitor for this. She reports that she will continue to try to reduce Seroquel and use lowest possible effective dose.  Continue Seroquel 50-100 mg po QHS and 25 mg 1-2 tabs po prn middle of the night awakenings.  Continue Lunesta 3 mg po QHS for insomnia.  Continue Wellbutrin XL 150 mg po qd for depression.  Continue  Gabapentin 300 mg 1-2 capsules at bedtime for anxiety and insomnia.  Continue Sertraline 200 mg po qd for anxiety and depression.  Pt to follow-up in 6 months or sooner if  clinically indicated.  Patient advised to contact office with any questions, adverse effects, or acute worsening in signs and symptoms.  Avina was seen today for follow-up.  Diagnoses and all orders for this visit:  Depression, unspecified depression type -     buPROPion (WELLBUTRIN XL) 150 MG 24 hr tablet; Take 1 tablet (150 mg total) by mouth daily. -     sertraline (ZOLOFT) 100 MG tablet; Take 2 tablets (200 mg total) by mouth daily.  Insomnia, unspecified type -     Eszopiclone 3 MG TABS; Take 1 tablet (3 mg total) by mouth at bedtime. -     gabapentin (NEURONTIN) 300 MG capsule; Take 1-2 tabs po QHS  Primary insomnia -     QUEtiapine (SEROQUEL) 25 MG tablet; Take 1-2 tabs po prn middle of the night awakening  Generalized anxiety disorder -     sertraline (ZOLOFT) 100 MG tablet; Take 2 tablets (200 mg total) by mouth daily.     Please see After Visit Summary for patient specific instructions.  Future Appointments  Date Time Provider [place]  01/30/2022  1:00 PM Lomax, Amy, NP [facility] None    No orders of the defined types were placed in this encounter.     -------------------------------

## 2021-11-27 ENCOUNTER — Encounter: Payer: Self-pay | Admitting: Gastroenterology

## 2021-12-12 ENCOUNTER — Encounter: Payer: Self-pay | Admitting: Family Medicine

## 2021-12-12 ENCOUNTER — Telehealth: Payer: Self-pay | Admitting: Family Medicine

## 2021-12-12 NOTE — Telephone Encounter (Signed)
Could not reach pt on home or mobile #s, both VM boxes full. Sent MyChart message and cancellation letter informing pt of r/s needed for 6/13 appt- Amy out of the office. ?

## 2022-01-02 ENCOUNTER — Telehealth: Payer: Self-pay | Admitting: *Deleted

## 2022-01-02 NOTE — Telephone Encounter (Signed)
Sheila,  This pt is cleared for anesthetic care at [facility].  Thanks,  Sulamita Lafountain 

## 2022-01-02 NOTE — Telephone Encounter (Signed)
Please review chart and advise if can have procedure in [place]? ?

## 2022-01-02 NOTE — Telephone Encounter (Signed)
NOTED

## 2022-01-29 ENCOUNTER — Ambulatory Visit (AMBULATORY_SURGERY_CENTER): Payer: BC Managed Care – PPO | Admitting: *Deleted

## 2022-01-29 VITALS — Ht 63.0 in | Wt 237.0 lb

## 2022-01-29 DIAGNOSIS — Z86010 Personal history of colonic polyps: Secondary | ICD-10-CM

## 2022-01-29 MED ORDER — NA SULFATE-K SULFATE-MG SULF 17.5-3.13-1.6 GM/177ML PO SOLN: 1.0000 | Freq: Once | ORAL | 0 refills | Status: AC

## 2022-01-29 NOTE — Progress Notes (Signed)

## 2022-01-30 ENCOUNTER — Ambulatory Visit: Payer: BC Managed Care – PPO | Admitting: Family Medicine

## 2022-02-01 DIAGNOSIS — I2510 Atherosclerotic heart disease of native coronary artery without angina pectoris: Secondary | ICD-10-CM | POA: Insufficient documentation

## 2022-02-01 DIAGNOSIS — E785 Hyperlipidemia, unspecified: Secondary | ICD-10-CM | POA: Insufficient documentation

## 2022-02-01 NOTE — Progress Notes (Signed)
Cardiology Office Note   Date:  02/02/2022   ID:  [name], DOB May 16, 1966, MRN [id number]  PCP:  Elisabeth Cara, PA-C  Cardiologist:   None Referring:  None  Chief Complaint  Patient presents with   Coronary Artery Disease      History of Present Illness: Joan West is a 56 y.o. female who presents for evaluation of CAD and HTN.  She has a very strong family history of obstructive coronary disease with her father dying at age 41 with coronary disease and her brother already has an LVAD.  She was seeing another cardiology group in the region.  She actually had a stress echocardiogram which I think was equivocal.  I do see a cardiac catheterization in July 2020 that demonstrated minor luminal irregularities in multiple vessels with the most focal lesion being a 20 to 30% mid circumflex stenosis.       At the last visit she has no new cardiovascular complaints.  Despite previous gastric surgery she has gained some weight.  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  She denies any cardiovascular symptoms.  The patient denies any new symptoms such as chest discomfort, neck or arm discomfort. There has been no new shortness of breath, PND or orthopnea. There have been no reported palpitations, presyncope or syncope.   Past Medical History:  Diagnosis Date   Anemia    has received iron infusions   Anxiety    Arthritis    mid foot arthristis   Cancer ([place])    Carpal tunnel syndrome    Headache    Hyperlipidemia    Hypertension    Insomnia    Migraine    Obstructive sleep apnea    PONV (postoperative nausea and vomiting)    Skin cancer    Skin cancer, basal cell    Sleep apnea    wears cpap   Vision loss of left eye    history of lazy eye    Past Surgical History:  Procedure Laterality Date   ABLATION     APPENDECTOMY     BILATERAL CARPAL TUNNEL RELEASE     CARDIAC CATHETERIZATION     CESAREAN SECTION     x2   COLONOSCOPY      DILITATION & CURRETTAGE/HYSTROSCOPY WITH NOVASURE ABLATION N/A 04/03/2016   Procedure: DILATATION & CURETTAGE/HYSTEROSCOPY WITH NOVASURE ABLATION;  Surgeon: Brien Few, MD;  Location: [facility];  Service: Gynecology;  Laterality: N/A;   GASTRIC BYPASS     POLYPECTOMY     skin cancer     SKIN CANCER EXCISION       Current Outpatient Medications  Medication Sig Dispense Refill   amLODipine (NORVASC) 5 MG tablet Take 1 tablet (5 mg total) by mouth daily. 30 tablet 11   buPROPion (WELLBUTRIN XL) 150 MG 24 hr tablet Take 1 tablet (150 mg total) by mouth daily. 90 tablet 1   Cholecalciferol (VITAMIN D PO) Take 1 tablet by mouth daily.     Cyanocobalamin (B-12 PO) Take 1 tablet by mouth daily.     Eszopiclone 3 MG TABS Take 1 tablet (3 mg total) by mouth at bedtime. 30 tablet 5   gabapentin (NEURONTIN) 300 MG capsule Take 1-2 tabs po QHS 180 capsule 1   losartan (COZAAR) 100 MG tablet Take 1 tablet (100 mg total) by mouth daily. 30 tablet 11   Multiple Vitamin (MULTIVITAMIN) tablet Take 1 tablet by mouth daily. Centrum Silver daily  Omega-3 Fatty Acids (FISH OIL) 1000 MG CAPS Take by mouth.     QUEtiapine (SEROQUEL) 25 MG tablet Take 1-2 tabs po prn middle of the night awakening 180 tablet 1   QUEtiapine (SEROQUEL) 50 MG tablet Take 1-2 tablets (50-100 mg total) by mouth at bedtime. 180 tablet 1   rosuvastatin (CRESTOR) 20 MG tablet TAKE ONE TABLET (20 MG DOSE) BY MOUTH DAILY.     sertraline (ZOLOFT) 100 MG tablet Take 2 tablets (200 mg total) by mouth daily. 180 tablet 2   Na Sulfate-K Sulfate-Mg Sulf 17.5-3.13-1.6 GM/177ML SOLN Take by mouth. (Patient not taking: Reported on 02/02/2022)     rizatriptan (MAXALT-MLT) 10 MG disintegrating tablet Take by mouth as needed. (Patient not taking: Reported on 02/02/2022)     SUMAtriptan (IMITREX) 100 MG tablet Take 1 tablet (100 mg total) by mouth once as needed for up to 1 dose for migraine. May repeat in 2 hours if headache persists or recurs.  (Patient not taking: Reported on 02/02/2022) 10 tablet 2   No current facility-administered medications for this visit.    Allergies:   Sulfa antibiotics     ROS:  Please see the history of present illness.   Otherwise, review of systems are positive for none.   All other systems are reviewed and negative.    PHYSICAL EXAM: VS:  BP 138/84 (BP Location: Left Arm, Patient Position: Sitting, Cuff Size: Normal)   Pulse 76   Ht '5\' 3"'$  (1.6 m)   Wt 237 lb 3.2 oz (107.6 kg)   SpO2 97%   BMI 42.02 kg/m  , BMI Body mass index is 42.02 kg/m. GENERAL:  Well appearing NECK:  No jugular venous distention, waveform within normal limits, carotid upstroke brisk and symmetric, no bruits, no thyromegaly LUNGS:  Clear to auscultation bilaterally CHEST:  Unremarkable HEART:  PMI not displaced or sustained,S1 and S2 within normal limits, no S3, no S4, no clicks, no rubs, no murmurs ABD:  Flat, positive bowel sounds normal in frequency in pitch, no bruits, no rebound, no guarding, no midline pulsatile mass, no hepatomegaly, no splenomegaly EXT:  2 plus pulses throughout, no edema, no cyanosis no clubbing   EKG:  EKG is  ordered today. The ekg ordered today demonstrates sinus rhythm, rate 76, axis within normal limits intervals within normal limits, no acute ST-T wave changes.   Recent Labs: No results found for requested labs within last 365 days.    Lipid Panel No results found for: "CHOL", "TRIG", "HDL", "CHOLHDL", "VLDL", "LDLCALC", "LDLDIRECT"    Wt Readings from Last 3 Encounters:  02/02/22 237 lb 3.2 oz (107.6 kg)  01/29/22 237 lb (107.5 kg)  01/30/21 239 lb 8 oz (108.6 kg)      Other studies Reviewed: Additional studies/ records that were reviewed today include: None Review of the above records demonstrates:  Please see elsewhere in the note.     ASSESSMENT AND PLAN:  NON OBSTRUCTIVE CAD:   The patient has no new sypmtoms.  No further cardiovascular testing is indicated.  We  will continue with aggressive risk reduction and meds as listed.  HTN: Her blood pressure is mildly elevated but I would like  DYSLIPIDEMIA: Her lipids have not been checked recently I will check a lipid profile, CMP, CBC and a TSH.  MORBID OBESITY: We have another long discussion about this.  I am going to try to get her on [facility].      Current medicines are reviewed at length with the  patient today.  The patient does not have concerns regarding medicines.  The following changes have been made: As above  Labs/ tests ordered today include:      Orders Placed This Encounter  Procedures   Lipid panel   Comprehensive Metabolic Panel (CMET)   CBC   TSH   AMB Referral to [facility]   EKG 12-Lead      Disposition:   FU with in 12 months or sooner if needed.   Signed, Minus Breeding, MD  02/02/2022 10:05 AM    [place]

## 2022-02-02 ENCOUNTER — Telehealth: Payer: Self-pay | Admitting: Gastroenterology

## 2022-02-02 ENCOUNTER — Encounter: Payer: Self-pay | Admitting: Cardiology

## 2022-02-02 ENCOUNTER — Ambulatory Visit (INDEPENDENT_AMBULATORY_CARE_PROVIDER_SITE_OTHER): Payer: Managed Care, Other (non HMO) | Admitting: Cardiology

## 2022-02-02 VITALS — BP 138/84 | HR 76 | Ht 63.0 in | Wt 237.2 lb

## 2022-02-02 DIAGNOSIS — R634 Abnormal weight loss: Secondary | ICD-10-CM | POA: Diagnosis not present

## 2022-02-02 DIAGNOSIS — I2510 Atherosclerotic heart disease of native coronary artery without angina pectoris: Secondary | ICD-10-CM

## 2022-02-02 DIAGNOSIS — E785 Hyperlipidemia, unspecified: Secondary | ICD-10-CM | POA: Diagnosis not present

## 2022-02-02 DIAGNOSIS — I10 Essential (primary) hypertension: Secondary | ICD-10-CM | POA: Diagnosis not present

## 2022-02-02 NOTE — Telephone Encounter (Signed)
Inbound call from patient. She is now rescheduled for 03/14/22 at 2:30 pm due to her son having surgery. Patient has already picked up her prep but will need new updated prep info.

## 2022-02-02 NOTE — Telephone Encounter (Signed)
New prep instructions sent to [facility] and mailed to pt.

## 2022-02-02 NOTE — Patient Instructions (Signed)
  Follow-Up: At [facility], you and your health needs are our priority.  As part of our continuing mission to provide you with exceptional heart care, we have created designated Provider Care Teams.  These Care Teams include your primary Cardiologist (physician) and Advanced Practice Providers (APPs -  Physician Assistants and Nurse Practitioners) who all work together to provide you with the care you need, when you need it.  We recommend signing up for the patient portal called "MyChart".  Sign up information is provided on this After Visit Summary.  MyChart is used to connect with patients for Virtual Visits (Telemedicine).  Patients are able to view lab/test results, encounter notes, upcoming appointments, etc.  Non-urgent messages can be sent to your provider as well.   To learn more about what you can do with MyChart, go to [email or web address].    Your next appointment:   12 month(s)  The format for your next appointment:   In Person  Provider:   James Hochrein MD      Important Information About Sugar       

## 2022-02-03 LAB — COMPREHENSIVE METABOLIC PANEL
ALT: 26 IU/L (ref 0–32)
AST: 24 IU/L (ref 0–40)
Albumin/Globulin Ratio: 1.9 (ref 1.2–2.2)
Albumin: 4.5 g/dL (ref 3.8–4.9)
Alkaline Phosphatase: 97 IU/L (ref 44–121)
BUN/Creatinine Ratio: 12 (ref 9–23)
BUN: 12 mg/dL (ref 6–24)
Bilirubin Total: 0.4 mg/dL (ref 0.0–1.2)
CO2: 28 mmol/L (ref 20–29)
Calcium: 9.9 mg/dL (ref 8.7–10.2)
Chloride: 102 mmol/L (ref 96–106)
Creatinine, Ser: 1.02 mg/dL — ABNORMAL HIGH (ref 0.57–1.00)
Globulin, Total: 2.4 g/dL (ref 1.5–4.5)
Glucose: 105 mg/dL — ABNORMAL HIGH (ref 70–99)
Potassium: 4.4 mmol/L (ref 3.5–5.2)
Sodium: 140 mmol/L (ref 134–144)
Total Protein: 6.9 g/dL (ref 6.0–8.5)
eGFR: 65 mL/min/{1.73_m2} (ref >59)

## 2022-02-03 LAB — LIPID PANEL
Chol/HDL Ratio: 2.5 ratio (ref 0.0–4.4)
Cholesterol, Total: 178 mg/dL (ref 100–199)
HDL: 70 mg/dL (ref >39)
LDL Chol Calc (NIH): 88 mg/dL (ref 0–99)
Triglycerides: 112 mg/dL (ref 0–149)
VLDL Cholesterol Cal: 20 mg/dL (ref 5–40)

## 2022-02-03 LAB — CBC
Hematocrit: 37.3 % (ref 34.0–46.6)
Hemoglobin: 12.7 g/dL (ref 11.1–15.9)
MCH: 29.8 pg (ref 26.6–33.0)
MCHC: 34 g/dL (ref 31.5–35.7)
MCV: 88 fL (ref 79–97)
Platelets: 256 10*3/uL (ref 150–450)
RBC: 4.26 x10E6/uL (ref 3.77–5.28)
RDW: 13.6 % (ref 11.7–15.4)
WBC: 5.4 10*3/uL (ref 3.4–10.8)

## 2022-02-03 LAB — TSH: TSH: 0.697 u[IU]/mL (ref 0.450–4.500)

## 2022-02-05 ENCOUNTER — Telehealth: Payer: Self-pay | Admitting: Psychiatry

## 2022-02-05 ENCOUNTER — Other Ambulatory Visit: Payer: Self-pay

## 2022-02-05 DIAGNOSIS — G4700 Insomnia, unspecified: Secondary | ICD-10-CM

## 2022-02-05 MED ORDER — ESZOPICLONE 3 MG PO TABS: 3.0000 mg | ORAL_TABLET | Freq: Every day | ORAL | 0 refills | Status: DC

## 2022-02-05 NOTE — Telephone Encounter (Signed)
Pended.

## 2022-02-05 NOTE — Telephone Encounter (Signed)
Pt called at 9:36 am and asked for a refill on her Eszopiclone 3 mg. Pharmacy is [facility] on [address] in [place]. Next appt 6/28

## 2022-02-09 ENCOUNTER — Encounter: Payer: Self-pay | Admitting: Pharmacist Clinician (PhC)/ Clinical Pharmacy Specialist

## 2022-02-09 ENCOUNTER — Ambulatory Visit (INDEPENDENT_AMBULATORY_CARE_PROVIDER_SITE_OTHER): Payer: Managed Care, Other (non HMO) | Admitting: Pharmacist Clinician (PhC)/ Clinical Pharmacy Specialist

## 2022-02-09 VITALS — Ht 63.0 in | Wt 242.6 lb

## 2022-02-09 DIAGNOSIS — E66813 Obesity, class 3: Secondary | ICD-10-CM | POA: Insufficient documentation

## 2022-02-09 DIAGNOSIS — R634 Abnormal weight loss: Secondary | ICD-10-CM

## 2022-02-14 ENCOUNTER — Ambulatory Visit (INDEPENDENT_AMBULATORY_CARE_PROVIDER_SITE_OTHER): Payer: 59 | Admitting: Psychiatry

## 2022-02-14 ENCOUNTER — Encounter: Payer: Self-pay | Admitting: Psychiatry

## 2022-02-14 DIAGNOSIS — G4700 Insomnia, unspecified: Secondary | ICD-10-CM

## 2022-02-14 DIAGNOSIS — F411 Generalized anxiety disorder: Secondary | ICD-10-CM | POA: Diagnosis not present

## 2022-02-14 DIAGNOSIS — F5101 Primary insomnia: Secondary | ICD-10-CM

## 2022-02-14 DIAGNOSIS — F32A Depression, unspecified: Secondary | ICD-10-CM

## 2022-02-14 MED ORDER — QUETIAPINE FUMARATE 50 MG PO TABS: 50.0000 mg | ORAL_TABLET | Freq: Every day | ORAL | 1 refills | Status: DC

## 2022-02-14 MED ORDER — ESZOPICLONE 3 MG PO TABS: 3.0000 mg | ORAL_TABLET | Freq: Every day | ORAL | 5 refills | Status: DC

## 2022-02-14 MED ORDER — GABAPENTIN 300 MG PO CAPS: ORAL_CAPSULE | ORAL | 1 refills | Status: DC

## 2022-02-14 MED ORDER — BUPROPION HCL ER (XL) 150 MG PO TB24
150.0000 mg | ORAL_TABLET | Freq: Every day | ORAL | 1 refills | Status: DC
Start: 2022-02-14 — End: 2022-08-16

## 2022-02-14 MED ORDER — SERTRALINE HCL 100 MG PO TABS: 200.0000 mg | ORAL_TABLET | Freq: Every day | ORAL | 2 refills | Status: DC

## 2022-02-14 MED ORDER — QUETIAPINE FUMARATE 25 MG PO TABS: ORAL_TABLET | ORAL | 1 refills | Status: DC

## 2022-02-14 NOTE — Progress Notes (Signed)
Joan West [id number] 1966/03/12 56 y.o.  Subjective:   Patient ID:  Joan West is a 56 y.o. (DOB 1966/08/19) female.  Chief Complaint:  Chief Complaint  Patient presents with   Follow-up    Insomnia, anxiety, depression    HPI Joan West presents to the office today for follow-up of anxiety, insomnia, and depression. She reports that she is waking up at night sometimes. She has been drinking wine before bed and this may contribute to waking up. She reports that she started this pattern about 2-3 months ago. She reports that she sometimes will wake up during the night and drinking more wine.She reports that she started drinking wine in the evenings when she first started to have insomnia. She reports that she stopped wine for 6-8 months and then started it again when there were multiple stressors in the family. Reports that she does not drink socially. Denies depressed mood. Energy and motivation have been good. Denies difficulty with concentration. Appetite has been "too much." She reports that cardiologist discussed starting Wegovy. Has tried some intermittent fasting. Denies SI.   She denies significant anxiety or stress currently. She reports that acute family stressors have improved. Son had some mental health issues and she had some stressors with her brother. Has not seen brother recently.   Son will have shoulder surgery tomorrow.   Work has been going ok.   Typically taking Seroquel 50 mg at bedtime and will take 25 mg as needed.   Past medication trials: Cymbalta-helpful for depression but not anxiety Sertraline-effective for mood and anxiety. Thinks it may cause intrusive Citalopram Seroquel Trazodone-ineffective Melatonin-ineffective Benadryl-ineffective for insomnia Ambien-parasomnias Belsomra- unsure of benefit.  Trileptal-ineffective for insomnia or anxiety Gabapentin [facility] Office Visit from 02/14/2022 in [place] Office Visit from 08/08/2020 in [place] Office Visit from 11/24/2019 in [facility]  AIMS Total Score 1 1 0        Review of Systems:  Review of Systems  Musculoskeletal:  Negative for gait problem.  Neurological:  Negative for tremors.  Psychiatric/Behavioral:         Please refer to HPI    Medications: I have reviewed the patient's current medications.  Current Outpatient Medications  Medication Sig Dispense Refill   amLODipine (NORVASC) 5 MG tablet Take 1 tablet (5 mg total) by mouth daily. 30 tablet 11   Cholecalciferol (VITAMIN D PO) Take 1 tablet by mouth daily.     Cyanocobalamin (B-12 PO) Take 1 tablet by mouth daily.     losartan (COZAAR) 100 MG tablet Take 1 tablet (100 mg total) by mouth daily. 30 tablet 11   Multiple Vitamin (MULTIVITAMIN) tablet Take 1 tablet by mouth daily. Centrum Silver daily     Omega-3 Fatty Acids (FISH OIL) 1000 MG CAPS Take by mouth.     rizatriptan (MAXALT-MLT) 10 MG disintegrating tablet Take by mouth as needed.     rosuvastatin (CRESTOR) 20 MG tablet TAKE ONE TABLET (20 MG DOSE) BY MOUTH DAILY.     SUMAtriptan (IMITREX) 100 MG tablet Take 1 tablet (100 mg total) by mouth once as needed for up to 1 dose for migraine. May repeat in 2 hours if headache persists or recurs. 10 tablet 2   buPROPion (WELLBUTRIN XL) 150 MG 24 hr tablet Take 1 tablet (150 mg total) by mouth daily. 90 tablet 1   [START ON 03/06/2022] Eszopiclone 3 MG TABS Take 1 tablet (  3 mg total) by mouth at bedtime. 30 tablet 5   gabapentin (NEURONTIN) 300 MG capsule Take 1-2 tabs po QHS 180 capsule 1   Na Sulfate-K Sulfate-Mg Sulf 17.5-3.13-1.6 GM/177ML SOLN Take by mouth.     QUEtiapine (SEROQUEL) 25 MG tablet Take 1-2 tabs po prn middle of the night awakening 180 tablet 1   QUEtiapine (SEROQUEL) 50 MG tablet Take 1-2 tablets (50-100 mg total) by mouth at bedtime. 180 tablet 1   sertraline (ZOLOFT) 100 MG tablet Take 2 tablets  (200 mg total) by mouth daily. 180 tablet 2   No current facility-administered medications for this visit.    Medication Side Effects: None  Allergies:  Allergies  Allergen Reactions   Sulfa Antibiotics Rash    fever    Past Medical History:  Diagnosis Date   Anemia    has received iron infusions   Anxiety    Arthritis    mid foot arthristis   Cancer ([place])    Carpal tunnel syndrome    Headache    Hyperlipidemia    Hypertension    Insomnia    Migraine    Obstructive sleep apnea    PONV (postoperative nausea and vomiting)    Skin cancer    Skin cancer, basal cell    Sleep apnea    wears cpap   Vision loss of left eye    history of lazy eye    Past Medical History, Surgical history, Social history, and Family history were reviewed and updated as appropriate.   Please see review of systems for further details on the patient's review from today.   Objective:   Physical Exam:  There were no vitals taken for this visit.  Physical Exam Constitutional:      General: She is not in acute distress. Musculoskeletal:        General: No deformity.  Neurological:     Mental Status: She is alert and oriented to person, place, and time.     Coordination: Coordination normal.  Psychiatric:        Attention and Perception: Attention and perception normal. She does not perceive auditory or visual hallucinations.        Mood and Affect: Mood normal. Mood is not anxious or depressed. Affect is not labile, blunt, angry or inappropriate.        Speech: Speech normal.        Behavior: Behavior normal.        Thought Content: Thought content normal. Thought content is not paranoid or delusional. Thought content does not include homicidal or suicidal ideation. Thought content does not include homicidal or suicidal plan.        Cognition and Memory: Cognition and memory normal.        Judgment: Judgment normal.     Comments: Insight intact     Lab Review:     Component Value  Date/Time   NA 140 02/02/2022 1057   K 4.4 02/02/2022 1057   CL 102 02/02/2022 1057   CO2 28 02/02/2022 1057   GLUCOSE 105 (H) 02/02/2022 1057   GLUCOSE 85 03/29/2016 1135   BUN 12 02/02/2022 1057   CREATININE 1.02 (H) 02/02/2022 1057   CALCIUM 9.9 02/02/2022 1057   PROT 6.9 02/02/2022 1057   ALBUMIN 4.5 02/02/2022 1057   AST 24 02/02/2022 1057   ALT 26 02/02/2022 1057   ALKPHOS 97 02/02/2022 1057   BILITOT 0.4 02/02/2022 1057   GFRNONAA >60 03/29/2016 1135   GFRAA >60  03/29/2016 1135       Component Value Date/Time   WBC 5.4 02/02/2022 1057   WBC 6.8 03/29/2016 1135   RBC 4.26 02/02/2022 1057   RBC 3.84 (L) 03/29/2016 1135   HGB 12.7 02/02/2022 1057   HCT 37.3 02/02/2022 1057   PLT 256 02/02/2022 1057   MCV 88 02/02/2022 1057   MCH 29.8 02/02/2022 1057   MCH 31.0 03/29/2016 1135   MCHC 34.0 02/02/2022 1057   MCHC 34.0 03/29/2016 1135   RDW 13.6 02/02/2022 1057   LYMPHSABS 3.0 03/15/2014 0540   MONOABS 0.6 03/15/2014 0540   EOSABS 0.2 03/15/2014 0540   BASOSABS 0.0 03/15/2014 0540    No results found for: "POCLITH", "LITHIUM"   No results found for: "PHENYTOIN", "PHENOBARB", "VALPROATE", "CBMZ"   .res Assessment: Plan:    Pt seen for 30 minutes and time spent discussing alcohol use at bedtime and strategies to possibly reduce alcohol intake. Discussed considering reasons for drinking and trying to then replace this with something else that might have a similar benefit. Discussed not keeping alcohol in the house since she typically only wants alcohol after 8 pm. Discussed that she may wish to consider using resources, such as AA or [facility]. Recommended that she contact office if she would like additional referrals or assistance with alcohol use in the future.  Discussed considering starting NAC 600 mg po BID since this is used off label for different types of compulsive behaviors to include nail biting/picking, food cravings, and alcohol cravings.  Continue  Seroquel 50-100 mg po QHS and 25 mg 1-2 tabs po prn middle of the night awakenings.  Continue Lunesta 3 mg po QHS for insomnia.  Continue Wellbutrin XL 150 mg po qd for depression.  Continue Gabapentin 300 mg 1-2 capsules at bedtime for anxiety and insomnia.  Continue Sertraline 200 mg po qd for anxiety and depression. Pt to follow-up in 6 months or sooner if clinically indicated.  Patient advised to contact office with any questions, adverse effects, or acute worsening in signs and symptoms.   Jadeyn was seen today for follow-up.  Diagnoses and all orders for this visit:  Depression, unspecified depression type -     buPROPion (WELLBUTRIN XL) 150 MG 24 hr tablet; Take 1 tablet (150 mg total) by mouth daily. -     sertraline (ZOLOFT) 100 MG tablet; Take 2 tablets (200 mg total) by mouth daily.  Insomnia, unspecified type -     Eszopiclone 3 MG TABS; Take 1 tablet (3 mg total) by mouth at bedtime. -     gabapentin (NEURONTIN) 300 MG capsule; Take 1-2 tabs po QHS  Generalized anxiety disorder -     sertraline (ZOLOFT) 100 MG tablet; Take 2 tablets (200 mg total) by mouth daily.  Primary insomnia -     QUEtiapine (SEROQUEL) 50 MG tablet; Take 1-2 tablets (50-100 mg total) by mouth at bedtime. -     QUEtiapine (SEROQUEL) 25 MG tablet; Take 1-2 tabs po prn middle of the night awakening     Please see After Visit Summary for patient specific instructions.  Future Appointments  Date Time Provider [place]  03/14/2022  2:30 PM Mauri Pole, MD [facility]  08/16/2022  9:30 AM Thayer Headings, PMHNP [facility] None    No orders of the defined types were placed in this encounter.   -------------------------------

## 2022-02-15 ENCOUNTER — Encounter: Payer: BC Managed Care – PPO | Admitting: Gastroenterology

## 2022-03-08 ENCOUNTER — Encounter: Payer: Self-pay | Admitting: Gastroenterology

## 2022-03-08 ENCOUNTER — Encounter: Payer: Self-pay | Admitting: Cardiology

## 2022-03-08 MED ORDER — ROSUVASTATIN CALCIUM 20 MG PO TABS: ORAL_TABLET | ORAL | 3 refills | Status: DC

## 2022-03-14 ENCOUNTER — Ambulatory Visit (AMBULATORY_SURGERY_CENTER): Payer: Managed Care, Other (non HMO) | Admitting: Gastroenterology

## 2022-03-14 ENCOUNTER — Encounter: Payer: Self-pay | Admitting: Gastroenterology

## 2022-03-14 VITALS — BP 110/66 | HR 77 | Temp 97.6°F | Resp 12 | Ht 63.5 in | Wt 237.0 lb

## 2022-03-14 DIAGNOSIS — D123 Benign neoplasm of transverse colon: Secondary | ICD-10-CM

## 2022-03-14 DIAGNOSIS — Z09 Encounter for follow-up examination after completed treatment for conditions other than malignant neoplasm: Secondary | ICD-10-CM | POA: Diagnosis present

## 2022-03-14 DIAGNOSIS — Z86010 Personal history of colonic polyps: Secondary | ICD-10-CM

## 2022-03-14 MED ORDER — SODIUM CHLORIDE 0.9 % IV SOLN: 500.0000 mL | Freq: Once | INTRAVENOUS | Status: AC

## 2022-03-14 NOTE — Progress Notes (Signed)
Called to room to assist during endoscopic procedure.  Patient ID and intended procedure confirmed with present staff. Received instructions for my participation in the procedure from the performing physician.  

## 2022-03-14 NOTE — Progress Notes (Signed)
Cell phone off per pt Pt's states no medical or surgical changes since previsit or office visit.  

## 2022-03-14 NOTE — Patient Instructions (Addendum)
Handout on polyps, hemorrhoids, and diverticulosis provided   Await pathology results.   Continue current medications.   Repeat colonoscopy in 5 years for surveillance   YOU HAD AN ENDOSCOPIC PROCEDURE TODAY AT [facility]:   Refer to the procedure report that was given to you for any specific questions about what was found during the examination.  If the procedure report does not answer your questions, please call your gastroenterologist to clarify.  If you requested that your care partner not be given the details of your procedure findings, then the procedure report has been included in a sealed envelope for you to review at your convenience later.  YOU SHOULD EXPECT: Some feelings of bloating in the abdomen. Passage of more gas than usual.  Walking can help get rid of the air that was put into your GI tract during the procedure and reduce the bloating. If you had a lower endoscopy (such as a colonoscopy or flexible sigmoidoscopy) you may notice spotting of blood in your stool or on the toilet paper. If you underwent a bowel prep for your procedure, you may not have a normal bowel movement for a few days.  Please Note:  You might notice some irritation and congestion in your nose or some drainage.  This is from the oxygen used during your procedure.  There is no need for concern and it should clear up in a day or so.  SYMPTOMS TO REPORT IMMEDIATELY:  Following lower endoscopy (colonoscopy or flexible sigmoidoscopy):  Excessive amounts of blood in the stool  Significant tenderness or worsening of abdominal pains  Swelling of the abdomen that is new, acute  Fever of 100F or higher  For urgent or emergent issues, a gastroenterologist can be reached at any hour by calling [phone]. Do not use MyChart messaging for urgent concerns.    DIET:  We do recommend a small meal at first, but then you may proceed to your regular diet.  Drink plenty of fluids but you should avoid  alcoholic beverages for 24 hours.  ACTIVITY:  You should plan to take it easy for the rest of today and you should NOT DRIVE or use heavy machinery until tomorrow (because of the sedation medicines used during the test).    FOLLOW UP: Our staff will call the number listed on your records the next business day following your procedure.  We will call around 7:15- 8:00 am to check on you and address any questions or concerns that you may have regarding the information given to you following your procedure. If we do not reach you, we will leave a message.  If you develop any symptoms (ie: fever, flu-like symptoms, shortness of breath, cough etc.) before then, please call [phone].  If you test positive for Covid 19 in the 2 weeks post procedure, please call and report this information to [place].    If any biopsies were taken you will be contacted by phone or by letter within the next 1-3 weeks.  Please call us at [phone] if you have not heard about the biopsies in 3 weeks.    SIGNATURES/CONFIDENTIALITY: You and/or your care partner have signed paperwork which will be entered into your electronic medical record.  These signatures attest to the fact that that the information above on your After Visit Summary has been reviewed and is understood.  Full responsibility of the confidentiality of this discharge information lies with you and/or your care-partner.

## 2022-03-14 NOTE — Op Note (Signed)
[facility] Patient Name: Dewey Neukam Procedure Date: 03/14/2022 2:05 PM MRN: [id number] Endoscopist: Mauri Pole , MD Age: 56 Referring MD:  Date of Birth: 11/17/1965 Gender: Female Account #: [id number] Procedure:                Colonoscopy Indications:              High risk colon cancer surveillance: Personal                            history of colonic polyps, High risk colon cancer                            surveillance: Personal history of adenoma (10 mm or                            greater in size) Medicines:                Monitored Anesthesia Care Procedure:                Pre-Anesthesia Assessment:                           - Prior to the procedure, a History and Physical                            was performed, and patient medications and                            allergies were reviewed. The patient's tolerance of                            previous anesthesia was also reviewed. The risks                            and benefits of the procedure and the sedation                            options and risks were discussed with the patient.                            All questions were answered, and informed consent                            was obtained. Prior Anticoagulants: The patient has                            taken no previous anticoagulant or antiplatelet                            agents. ASA Grade Assessment: III - A patient with                            severe systemic disease. After reviewing the risks  and benefits, the patient was deemed in                            satisfactory condition to undergo the procedure.                           After obtaining informed consent, the colonoscope                            was passed under direct vision. Throughout the                            procedure, the patient's blood pressure, pulse, and                            oxygen saturations were monitored  continuously. The                            Olympus [id number]-[id number] (#[id number]) Colonoscope was                            introduced through the anus and advanced to the the                            cecum, identified by appendiceal orifice and                            ileocecal valve. The colonoscopy was performed                            without difficulty. The patient tolerated the                            procedure well. The quality of the bowel                            preparation was good. The ileocecal valve,                            appendiceal orifice, and rectum were photographed. Scope In: 2:20:52 PM Scope Out: 2:42:12 PM Scope Withdrawal Time: 0 hours 9 minutes 10 seconds  Total Procedure Duration: 0 hours 21 minutes 20 seconds  Findings:                 The perianal and digital rectal examinations were                            normal.                           A 8 mm polyp was found in the transverse colon. The                            polyp was sessile. The polyp was removed with a  cold snare. Resection and retrieval were complete.                           Scattered small-mouthed diverticula were found in                            the sigmoid colon, descending colon and ascending                            colon.                           Non-bleeding external and internal hemorrhoids were                            found during retroflexion. The hemorrhoids were                            medium-sized. Complications:            No immediate complications. Estimated Blood Loss:     Estimated blood loss was minimal. Impression:               - One 8 mm polyp in the transverse colon, removed                            with a cold snare. Resected and retrieved.                           - Diverticulosis in the sigmoid colon, in the                            descending colon and in the ascending colon.                           -  Non-bleeding external and internal hemorrhoids. Recommendation:           - Patient has a contact number available for                            emergencies. The signs and symptoms of potential                            delayed complications were discussed with the                            patient. Return to normal activities tomorrow.                            Written discharge instructions were provided to the                            patient.                           - Resume previous diet.                           -  Continue present medications.                           - Await pathology results.                           - Repeat colonoscopy in 5 years for surveillance. Mauri Pole, MD 03/14/2022 2:51:32 PM This report has been signed electronically.

## 2022-03-14 NOTE — Progress Notes (Signed)
PT taken to PACU. Monitors in place. VSS. Report given to RN. 

## 2022-03-14 NOTE — Progress Notes (Signed)
[facility] History and Physical   Primary Care Physician:  Belva Bertin, [place], [place]   Reason for Procedure:  History of adenomatous colon polyps  Plan:    Surveillance colonoscopy with possible interventions as needed     HPI: Joan West is a very pleasant 56 y.o. female here for surveillance colonoscopy. Denies any nausea, vomiting, abdominal pain, melena or bright red blood per rectum  The risks and benefits as well as alternatives of endoscopic procedure(s) have been discussed and reviewed. All questions answered. The patient agrees to proceed.    Past Medical History:  Diagnosis Date   Anemia    has received iron infusions   Anxiety    Arthritis    mid foot arthristis   Cancer ([place])    Carpal tunnel syndrome    Headache    Hyperlipidemia    Hypertension    Insomnia    Migraine    Obstructive sleep apnea    PONV (postoperative nausea and vomiting)    Skin cancer    Skin cancer, basal cell    Sleep apnea    wears cpap   Vision loss of left eye    history of lazy eye    Past Surgical History:  Procedure Laterality Date   ABLATION     APPENDECTOMY     BILATERAL CARPAL TUNNEL RELEASE     CARDIAC CATHETERIZATION     CESAREAN SECTION     x2   COLONOSCOPY     DILITATION & CURRETTAGE/HYSTROSCOPY WITH NOVASURE ABLATION N/A 04/03/2016   Procedure: DILATATION & CURETTAGE/HYSTEROSCOPY WITH NOVASURE ABLATION;  Surgeon: Brien Few, MD;  Location: [facility];  Service: Gynecology;  Laterality: N/A;   GASTRIC BYPASS     POLYPECTOMY     skin cancer     SKIN CANCER EXCISION      Prior to Admission medications   Medication Sig Start Date End Date Taking? Authorizing Provider  amLODipine (NORVASC) 5 MG tablet Take 1 tablet (5 mg total) by mouth daily. 03/16/21  Yes Minus Breeding, MD  buPROPion (WELLBUTRIN XL) 150 MG 24 hr tablet Take 1 tablet (150 mg total) by mouth daily. 02/14/22  Yes Thayer Headings, PMHNP  Cholecalciferol (VITAMIN D PO) Take  1 tablet by mouth daily.   Yes [provider]  Cyanocobalamin (B-12 PO) Take 1 tablet by mouth daily.   Yes [provider]  Eszopiclone 3 MG TABS Take 1 tablet (3 mg total) by mouth at bedtime. 03/06/22 04/05/22 Yes Thayer Headings, PMHNP  gabapentin (NEURONTIN) 300 MG capsule Take 1-2 tabs po QHS 02/14/22  Yes Thayer Headings, PMHNP  losartan (COZAAR) 100 MG tablet Take 1 tablet (100 mg total) by mouth daily. 03/16/21  Yes Minus Breeding, MD  Multiple Vitamin (MULTIVITAMIN) tablet Take 1 tablet by mouth daily. Centrum Silver daily   Yes [provider]  Omega-3 Fatty Acids (FISH OIL) 1000 MG CAPS Take by mouth.   Yes [provider]  QUEtiapine (SEROQUEL) 50 MG tablet Take 1-2 tablets (50-100 mg total) by mouth at bedtime. 02/14/22  Yes Thayer Headings, PMHNP  rosuvastatin (CRESTOR) 20 MG tablet TAKE ONE TABLET (20 MG DOSE) BY MOUTH DAILY. 03/08/22  Yes Minus Breeding, MD  sertraline (ZOLOFT) 100 MG tablet Take 2 tablets (200 mg total) by mouth daily. 02/14/22  Yes Thayer Headings, PMHNP  QUEtiapine (SEROQUEL) 25 MG tablet Take 1-2 tabs po prn middle of the night awakening 02/14/22   Thayer Headings, PMHNP  rizatriptan (MAXALT-MLT) 10 MG disintegrating tablet  Take by mouth as needed. 12/31/20   [provider]  SUMAtriptan (IMITREX) 100 MG tablet Take 1 tablet (100 mg total) by mouth once as needed for up to 1 dose for migraine. May repeat in 2 hours if headache persists or recurs. 06/29/20   Lomax, Amy, NP    Current Outpatient Medications  Medication Sig Dispense Refill   amLODipine (NORVASC) 5 MG tablet Take 1 tablet (5 mg total) by mouth daily. 30 tablet 11   buPROPion (WELLBUTRIN XL) 150 MG 24 hr tablet Take 1 tablet (150 mg total) by mouth daily. 90 tablet 1   Cholecalciferol (VITAMIN D PO) Take 1 tablet by mouth daily.     Cyanocobalamin (B-12 PO) Take 1 tablet by mouth daily.     Eszopiclone 3 MG TABS Take 1 tablet (3 mg total) by mouth at  bedtime. 30 tablet 5   gabapentin (NEURONTIN) 300 MG capsule Take 1-2 tabs po QHS 180 capsule 1   losartan (COZAAR) 100 MG tablet Take 1 tablet (100 mg total) by mouth daily. 30 tablet 11   Multiple Vitamin (MULTIVITAMIN) tablet Take 1 tablet by mouth daily. Centrum Silver daily     Omega-3 Fatty Acids (FISH OIL) 1000 MG CAPS Take by mouth.     QUEtiapine (SEROQUEL) 50 MG tablet Take 1-2 tablets (50-100 mg total) by mouth at bedtime. 180 tablet 1   rosuvastatin (CRESTOR) 20 MG tablet TAKE ONE TABLET (20 MG DOSE) BY MOUTH DAILY. 90 tablet 3   sertraline (ZOLOFT) 100 MG tablet Take 2 tablets (200 mg total) by mouth daily. 180 tablet 2   QUEtiapine (SEROQUEL) 25 MG tablet Take 1-2 tabs po prn middle of the night awakening 180 tablet 1   rizatriptan (MAXALT-MLT) 10 MG disintegrating tablet Take by mouth as needed.     SUMAtriptan (IMITREX) 100 MG tablet Take 1 tablet (100 mg total) by mouth once as needed for up to 1 dose for migraine. May repeat in 2 hours if headache persists or recurs. 10 tablet 2   Current Facility-Administered Medications  Medication Dose Route Frequency Provider Last Rate Last Admin   0.9 %  sodium chloride infusion  500 mL Intravenous Once Mauri Pole, MD        Allergies as of 03/14/2022 - Review Complete 03/14/2022  Allergen Reaction Noted   Sulfa antibiotics Rash 03/15/2014    Family History  Problem Relation Age of Onset   Lung disease Mother    Schizophrenia Mother    Drug abuse Mother    Heart attack Father    Heart disease Father    Drug abuse Father    Bipolar disorder Father    Heart attack Brother    Anxiety disorder Son    Colon cancer Cousin    Colon polyps Neg Hx    Esophageal cancer Neg Hx    Rectal cancer Neg Hx    Stomach cancer Neg Hx    Crohn's disease Neg Hx     Social History   Socioeconomic History   Marital status: Married    Spouse name: Not on file   Number of children: Not on file   Years of education: Not on file    Highest education level: Not on file  Occupational History   Not on file  Tobacco Use   Smoking status: Never    Passive exposure: Past (PARENT'S SMOKED BOTH)   Smokeless tobacco: Never  Vaping Use   Vaping Use: Never used  Substance and Sexual Activity  Alcohol use: Yes    Alcohol/week: 1.0 standard drink of alcohol    Types: 1 Glasses of wine per week    Comment: WINE 4 X PER WEEK   Drug use: No   Sexual activity: Yes    Birth control/protection: Surgical    Comment: hx ablation  Other Topics Concern   Not on file  Social History Narrative   Not on file   Social Determinants of Health   Financial Resource Strain: Not on file  Food Insecurity: Not on file  Transportation Needs: Not on file  Physical Activity: Not on file  Stress: Not on file  Social Connections: Not on file  Intimate Partner Violence: Not on file    Review of Systems:  All other review of systems negative except as mentioned in the HPI.  Physical Exam: Vital signs in last 24 hours: BP (!) 154/94   Pulse 74   Temp 97.6 F (36.4 C)   Ht 5' 3.5" (1.613 m)   Wt 237 lb (107.5 kg)   SpO2 96%   BMI 41.32 kg/m  General:   Alert, NAD Lungs:  Clear .   Heart:  Regular rate and rhythm Abdomen:  Soft, nontender and nondistended. Neuro/Psych:  Alert and cooperative. Normal mood and affect. A and O x 3  Reviewed labs, radiology imaging, old records and pertinent past GI work up  Patient is appropriate for planned procedure(s) and anesthesia in an ambulatory setting   K. Denzil Magnuson , MD [phone]

## 2022-03-15 ENCOUNTER — Telehealth: Payer: Self-pay

## 2022-03-15 NOTE — Telephone Encounter (Signed)
  Follow up Call-     03/14/2022    1:52 PM  Call back number  Post procedure Call Back phone  # [phone]  Permission to leave phone message Yes    Follow-up call, LVM.

## 2022-03-29 ENCOUNTER — Encounter: Payer: Self-pay | Admitting: Pharmacist Clinician (PhC)/ Clinical Pharmacy Specialist

## 2022-04-17 ENCOUNTER — Encounter: Payer: Self-pay | Admitting: Gastroenterology

## 2022-05-15 ENCOUNTER — Encounter: Payer: Self-pay | Admitting: Cardiology

## 2022-05-21 ENCOUNTER — Encounter: Payer: Self-pay | Admitting: Pharmacist Clinician (PhC)/ Clinical Pharmacy Specialist

## 2022-05-22 MED ORDER — AMLODIPINE BESYLATE 5 MG PO TABS: 5.0000 mg | ORAL_TABLET | Freq: Every day | ORAL | 3 refills | Status: DC

## 2022-07-02 ENCOUNTER — Encounter: Payer: Self-pay | Admitting: Pharmacist Clinician (PhC)/ Clinical Pharmacy Specialist

## 2022-07-03 MED ORDER — LOSARTAN POTASSIUM 100 MG PO TABS: 100.0000 mg | ORAL_TABLET | Freq: Every day | ORAL | 3 refills | Status: DC

## 2022-08-16 ENCOUNTER — Telehealth (INDEPENDENT_AMBULATORY_CARE_PROVIDER_SITE_OTHER): Payer: 59 | Admitting: Psychiatry

## 2022-08-16 ENCOUNTER — Encounter: Payer: Self-pay | Admitting: Psychiatry

## 2022-08-16 DIAGNOSIS — F411 Generalized anxiety disorder: Secondary | ICD-10-CM | POA: Diagnosis not present

## 2022-08-16 DIAGNOSIS — F32A Depression, unspecified: Secondary | ICD-10-CM | POA: Diagnosis not present

## 2022-08-16 DIAGNOSIS — G4700 Insomnia, unspecified: Secondary | ICD-10-CM | POA: Diagnosis not present

## 2022-08-16 MED ORDER — BUPROPION HCL ER (XL) 150 MG PO TB24: 150.0000 mg | ORAL_TABLET | Freq: Every day | ORAL | 1 refills | Status: DC

## 2022-08-16 MED ORDER — ESZOPICLONE 3 MG PO TABS: 3.0000 mg | ORAL_TABLET | Freq: Every day | ORAL | 5 refills | Status: DC

## 2022-08-16 MED ORDER — QUETIAPINE FUMARATE 25 MG PO TABS: ORAL_TABLET | ORAL | 1 refills | Status: DC

## 2022-08-16 MED ORDER — QUETIAPINE FUMARATE 50 MG PO TABS: 50.0000 mg | ORAL_TABLET | Freq: Every day | ORAL | 1 refills | Status: DC

## 2022-08-16 MED ORDER — GABAPENTIN 300 MG PO CAPS: ORAL_CAPSULE | ORAL | 1 refills | Status: DC

## 2022-08-16 MED ORDER — SERTRALINE HCL 100 MG PO TABS: 200.0000 mg | ORAL_TABLET | Freq: Every day | ORAL | 2 refills | Status: DC

## 2022-08-16 NOTE — Progress Notes (Signed)
Joan West [id number] 05/31/1966 56 y.o.  Virtual Visit via Video Note  I connected with pt @ on 08/16/22 at  9:30 AM EST by a video enabled telemedicine application and verified that I am speaking with the correct person using two identifiers.   I discussed the limitations of evaluation and management by telemedicine and the availability of in person appointments. The patient expressed understanding and agreed to proceed.  I discussed the assessment and treatment plan with the patient. The patient was provided an opportunity to ask questions and all were answered. The patient agreed with the plan and demonstrated an understanding of the instructions.   The patient was advised to call back or seek an in-person evaluation if the symptoms worsen or if the condition fails to improve as anticipated.  I provided 35 minutes of non-face-to-face time during this encounter.  The patient was located at home.  The provider was located at home.   Thayer Headings, PMHNP   Subjective:   Patient ID:  Joan West is a 56 y.o. (DOB 01/21/1966) female.  Chief Complaint:  Chief Complaint  Patient presents with   Insomnia    Insomnia   Joan West presents for follow-up of anxiety, depression, and insomnia. She reports that she has been doing ok overall. She reports that she has some worry about her son, who has been having some physical mental health issues. She reports that her brother is now homeless, staying with a family member, and is refusing medical care. She reports that she has felt "panicky" at times in response to acute stressors with son. She has had some increased worry at times. She reports that she has some worry now that she now considers "normal" and appropriate for the situation. She denies depressed mood. She reports "some" irritability in response to certain triggers. She reports that her sleep has been "ok." She will sometimes sleep 6 hours and other times 4 hours. She  has middle of the night awakenings at times. Energy and motivation have been "fine." She reports adequate concentration. Denies SI.   She reports that she would like to reduce ETOH intake. She reports that she drinks 2 cups of port wine nightly. "It's just a habit." She denies social ETOH use. She has tried not to get the kind that she likes and drinks less. She reports that she tried substituting hot tea and was using CALM magnesium powder. She reports that she stopped for 4 months and "went back to it."   She reports that she saw a gastric specialist. She was started on Topamax. She reports that it seems to have decreased her appetite. She denies any weight loss. Her ALT was 33.  She reports that her work has been ok.    Son will be 23 and daughter is 39 yo.  Lunesta last filled 07/24/22. Gabapentin last filled 04/29/22.   Past medication trials: Cymbalta-helpful for depression but not anxiety Sertraline-effective for mood and anxiety. Thinks it may cause intrusive Citalopram Seroquel Trazodone-ineffective Melatonin-ineffective Benadryl-ineffective for insomnia Ambien-parasomnias Belsomra- unsure of benefit.  Trileptal-ineffective for insomnia or anxiety Gabapentin Lunesta     Review of Systems:  Review of Systems  Musculoskeletal:  Negative for gait problem.  Neurological:  Negative for tremors.  Psychiatric/Behavioral:  The patient has insomnia.        Please refer to HPI    Medications: I have reviewed the patient's current medications.  Current Outpatient Medications  Medication Sig Dispense Refill   amLODipine (NORVASC)  5 MG tablet Take 1 tablet (5 mg total) by mouth daily. 90 tablet 3   Cholecalciferol (VITAMIN D PO) Take 1 tablet by mouth daily.     Cyanocobalamin (B-12 PO) Take 1 tablet by mouth daily.     losartan (COZAAR) 100 MG tablet Take 1 tablet (100 mg total) by mouth daily. 90 tablet 3   Multiple Vitamin (MULTIVITAMIN) tablet Take 1 tablet by mouth daily.  Centrum Silver daily     Omega-3 Fatty Acids (FISH OIL) 1000 MG CAPS Take by mouth.     rizatriptan (MAXALT-MLT) 10 MG disintegrating tablet Take by mouth as needed.     rosuvastatin (CRESTOR) 20 MG tablet TAKE ONE TABLET (20 MG DOSE) BY MOUTH DAILY. 90 tablet 3   SUMAtriptan (IMITREX) 100 MG tablet Take 1 tablet (100 mg total) by mouth once as needed for up to 1 dose for migraine. May repeat in 2 hours if headache persists or recurs. 10 tablet 2   topiramate (TOPAMAX) 50 MG tablet Take 1 tablet by mouth daily.     buPROPion (WELLBUTRIN XL) 150 MG 24 hr tablet Take 1 tablet (150 mg total) by mouth daily. 90 tablet 1   [START ON 08/21/2022] Eszopiclone 3 MG TABS Take 1 tablet (3 mg total) by mouth at bedtime. 30 tablet 5   gabapentin (NEURONTIN) 300 MG capsule Take 1-2 tabs po QHS 180 capsule 1   QUEtiapine (SEROQUEL) 25 MG tablet Take 1-2 tabs po prn middle of the night awakening 180 tablet 1   QUEtiapine (SEROQUEL) 50 MG tablet Take 1-2 tablets (50-100 mg total) by mouth at bedtime. 180 tablet 1   sertraline (ZOLOFT) 100 MG tablet Take 2 tablets (200 mg total) by mouth daily. 180 tablet 2   Current Facility-Administered Medications  Medication Dose Route Frequency Provider Last Rate Last Admin   0.9 %  sodium chloride infusion  500 mL Intravenous Once Nandigam, Venia Minks, MD        Medication Side Effects: None  Allergies:  Allergies  Allergen Reactions   Sulfa Antibiotics Rash    fever    Past Medical History:  Diagnosis Date   Anemia    has received iron infusions   Anxiety    Arthritis    mid foot arthristis   Cancer ([place])    Carpal tunnel syndrome    Headache    Hyperlipidemia    Hypertension    Insomnia    Migraine    Obstructive sleep apnea    PONV (postoperative nausea and vomiting)    Skin cancer    Skin cancer, basal cell    Sleep apnea    wears cpap   Vision loss of left eye    history of lazy eye    Family History  Problem Relation Age of Onset   Lung  disease Mother    Schizophrenia Mother    Drug abuse Mother    Heart attack Father    Heart disease Father    Drug abuse Father    Bipolar disorder Father    Heart attack Brother    Anxiety disorder Son    Colon cancer Cousin    Colon polyps Neg Hx    Esophageal cancer Neg Hx    Rectal cancer Neg Hx    Stomach cancer Neg Hx    Crohn's disease Neg Hx     Social History   Socioeconomic History   Marital status: Married    Spouse name: Not on file  Number of children: Not on file   Years of education: Not on file   Highest education level: Not on file  Occupational History   Not on file  Tobacco Use   Smoking status: Never    Passive exposure: Past (PARENT'S SMOKED BOTH)   Smokeless tobacco: Never  Vaping Use   Vaping Use: Never used  Substance and Sexual Activity   Alcohol use: Yes    Alcohol/week: 1.0 standard drink of alcohol    Types: 1 Glasses of wine per week    Comment: WINE 4 X PER WEEK   Drug use: No   Sexual activity: Yes    Birth control/protection: Surgical    Comment: hx ablation  Other Topics Concern   Not on file  Social History Narrative   Not on file   Social Determinants of Health   Financial Resource Strain: Not on file  Food Insecurity: Not on file  Transportation Needs: Not on file  Physical Activity: Not on file  Stress: Not on file  Social Connections: Not on file  Intimate Partner Violence: Not on file    Past Medical History, Surgical history, Social history, and Family history were reviewed and updated as appropriate.   Please see review of systems for further details on the patient's review from today.   Objective:   Physical Exam:  There were no vitals taken for this visit.  Physical Exam Neurological:     Mental Status: She is alert and oriented to person, place, and time.     Cranial Nerves: No dysarthria.  Psychiatric:        Attention and Perception: Attention and perception normal.        Mood and Affect: Mood is  not depressed.        Speech: Speech normal.        Behavior: Behavior is cooperative.        Thought Content: Thought content normal. Thought content is not paranoid or delusional. Thought content does not include homicidal or suicidal ideation. Thought content does not include homicidal or suicidal plan.        Cognition and Memory: Cognition and memory normal.        Judgment: Judgment normal.     Comments: Insight intact Mood is mildly anxious in response to family stressors     Lab Review:     Component Value Date/Time   NA 140 02/02/2022 1057   K 4.4 02/02/2022 1057   CL 102 02/02/2022 1057   CO2 28 02/02/2022 1057   GLUCOSE 105 (H) 02/02/2022 1057   GLUCOSE 85 03/29/2016 1135   BUN 12 02/02/2022 1057   CREATININE 1.02 (H) 02/02/2022 1057   CALCIUM 9.9 02/02/2022 1057   PROT 6.9 02/02/2022 1057   ALBUMIN 4.5 02/02/2022 1057   AST 24 02/02/2022 1057   ALT 26 02/02/2022 1057   ALKPHOS 97 02/02/2022 1057   BILITOT 0.4 02/02/2022 1057   GFRNONAA >60 03/29/2016 1135   GFRAA >60 03/29/2016 1135       Component Value Date/Time   WBC 5.4 02/02/2022 1057   WBC 6.8 03/29/2016 1135   RBC 4.26 02/02/2022 1057   RBC 3.84 (L) 03/29/2016 1135   HGB 12.7 02/02/2022 1057   HCT 37.3 02/02/2022 1057   PLT 256 02/02/2022 1057   MCV 88 02/02/2022 1057   MCH 29.8 02/02/2022 1057   MCH 31.0 03/29/2016 1135   MCHC 34.0 02/02/2022 1057   MCHC 34.0 03/29/2016 1135   RDW 13.6  02/02/2022 1057   LYMPHSABS 3.0 03/15/2014 0540   MONOABS 0.6 03/15/2014 0540   EOSABS 0.2 03/15/2014 0540   BASOSABS 0.0 03/15/2014 0540    No results found for: "POCLITH", "LITHIUM"   No results found for: "PHENYTOIN", "PHENOBARB", "VALPROATE", "CBMZ"   .res Assessment: Plan:    Pt seen for 35 minutes and time spent discussing stress-related anxiety and anticipatory anxiety surrounding insomnia. Discussed that alcohol use at night is likely anxiety related and tied to anxiety of not being able to sleep  well. Recommended seeing Lina Sayre, [facility], whom she has seen in the past, to address anxiety and for exploring other coping strategies and triggers for behavior. Discussed strategies to possibly help gradually reduce ETOH use.  She reports that she would like to continue current medications without changes at this time.  Continue Sertraline 200 mg daily for anxiety and depression.  Continue Seroquel 50-100 mg po QHS and 25 mg 1-2 tabs po prn middle of the night awakenings.  Continue Lunesta 3 mg po QHS for insomnia.  Continue Wellbutrin XL 150 mg po qd for depression.  Continue Gabapentin 300 mg 1-2 capsules at bedtime for anxiety and insomnia.  Pt to follow-up in 6 months or sooner if clinically indicated.  Patient advised to contact office with any questions, adverse effects, or acute worsening in signs and symptoms.   Kariya was seen today for insomnia.  Diagnoses and all orders for this visit:  Generalized anxiety disorder -     sertraline (ZOLOFT) 100 MG tablet; Take 2 tablets (200 mg total) by mouth daily.  Depression, unspecified depression type -     buPROPion (WELLBUTRIN XL) 150 MG 24 hr tablet; Take 1 tablet (150 mg total) by mouth daily. -     sertraline (ZOLOFT) 100 MG tablet; Take 2 tablets (200 mg total) by mouth daily.  Insomnia, unspecified type -     Eszopiclone 3 MG TABS; Take 1 tablet (3 mg total) by mouth at bedtime. -     gabapentin (NEURONTIN) 300 MG capsule; Take 1-2 tabs po QHS -     QUEtiapine (SEROQUEL) 50 MG tablet; Take 1-2 tablets (50-100 mg total) by mouth at bedtime. -     QUEtiapine (SEROQUEL) 25 MG tablet; Take 1-2 tabs po prn middle of the night awakening     Please see After Visit Summary for patient specific instructions.  No future appointments.   No orders of the defined types were placed in this encounter.     -------------------------------

## 2022-08-24 ENCOUNTER — Encounter: Payer: Self-pay | Admitting: Pharmacist Clinician (PhC)/ Clinical Pharmacy Specialist

## 2022-12-14 ENCOUNTER — Other Ambulatory Visit: Payer: Self-pay | Admitting: Cardiology

## 2022-12-24 ENCOUNTER — Other Ambulatory Visit: Payer: Self-pay | Admitting: Psychiatry

## 2022-12-24 DIAGNOSIS — F32A Depression, unspecified: Secondary | ICD-10-CM

## 2023-02-01 ENCOUNTER — Telehealth: Payer: Self-pay | Admitting: Cardiology

## 2023-02-01 DIAGNOSIS — E785 Hyperlipidemia, unspecified: Secondary | ICD-10-CM

## 2023-02-01 DIAGNOSIS — I10 Essential (primary) hypertension: Secondary | ICD-10-CM

## 2023-02-01 DIAGNOSIS — I2510 Atherosclerotic heart disease of native coronary artery without angina pectoris: Secondary | ICD-10-CM

## 2023-02-01 NOTE — Telephone Encounter (Signed)
Labs ordered. Call to patient, no answer.  LVM that labs were ordered per Dr Antoine Poche

## 2023-02-01 NOTE — Telephone Encounter (Signed)
Patient would like to know if she needs to have lab work prior to 9/27 appointment with Dr. Antoine Poche. Please advise.

## 2023-02-01 NOTE — Telephone Encounter (Signed)
Would you like same labs as ordered for last visit 01/2022 for upcoming visit for September Lipid   CMP    CBC   TSH   HgA1C?  Please advise

## 2023-02-01 NOTE — Telephone Encounter (Signed)
Lipid, CMP, CBC, TSH, A1C and LPa ordered

## 2023-02-04 ENCOUNTER — Other Ambulatory Visit: Payer: Self-pay | Admitting: Psychiatry

## 2023-02-04 DIAGNOSIS — G4700 Insomnia, unspecified: Secondary | ICD-10-CM

## 2023-02-04 NOTE — Telephone Encounter (Signed)
LF 5/22, due 6/19

## 2023-02-04 NOTE — Telephone Encounter (Signed)
Next appt is 02/14/23. Requesting a refill on Es zopiclone 3 mg called to:  [id number] - [place], [place] - [address]   Phone: [phone]  Fax: [phone]

## 2023-02-06 MED ORDER — ESZOPICLONE 3 MG PO TABS
3.0000 mg | ORAL_TABLET | Freq: Every day | ORAL | 5 refills | Status: DC
Start: 2023-02-06 — End: 2023-07-29

## 2023-02-06 NOTE — Telephone Encounter (Signed)
Pended.

## 2023-02-10 ENCOUNTER — Other Ambulatory Visit: Payer: Self-pay | Admitting: Psychiatry

## 2023-02-10 DIAGNOSIS — G4700 Insomnia, unspecified: Secondary | ICD-10-CM

## 2023-02-14 ENCOUNTER — Encounter: Payer: Self-pay | Admitting: Psychiatry

## 2023-02-14 ENCOUNTER — Ambulatory Visit (INDEPENDENT_AMBULATORY_CARE_PROVIDER_SITE_OTHER): Payer: 59 | Admitting: Psychiatry

## 2023-02-14 DIAGNOSIS — G4700 Insomnia, unspecified: Secondary | ICD-10-CM

## 2023-02-14 DIAGNOSIS — F32A Depression, unspecified: Secondary | ICD-10-CM | POA: Diagnosis not present

## 2023-02-14 DIAGNOSIS — F411 Generalized anxiety disorder: Secondary | ICD-10-CM

## 2023-02-14 MED ORDER — BUPROPION HCL ER (XL) 150 MG PO TB24
150.0000 mg | ORAL_TABLET | Freq: Every day | ORAL | 1 refills | Status: DC
Start: 2023-02-14 — End: 2023-08-16

## 2023-02-14 MED ORDER — SERTRALINE HCL 100 MG PO TABS
200.0000 mg | ORAL_TABLET | Freq: Every day | ORAL | 2 refills | Status: DC
Start: 2023-02-14 — End: 2023-08-16

## 2023-02-14 MED ORDER — QUETIAPINE FUMARATE 50 MG PO TABS
50.0000 mg | ORAL_TABLET | Freq: Every day | ORAL | 0 refills | Status: DC
Start: 2023-02-14 — End: 2023-08-16

## 2023-02-14 MED ORDER — GABAPENTIN 300 MG PO CAPS
ORAL_CAPSULE | ORAL | 1 refills | Status: DC
Start: 2023-02-14 — End: 2023-07-25

## 2023-02-14 NOTE — Progress Notes (Signed)
Joan West [id number] March 23, 1966 57 y.o.  Subjective:   Patient ID:  Joan West is a 57 y.o. (DOB 1965/12/22) female.  Chief Complaint:  Chief Complaint  Patient presents with   Anxiety   Sleeping Problem    Anxiety     Joan West presents to the office today for follow-up of anxiety, depression, and insomnia. She reports some recent stressors.   Father-in-law has been diagnosed with Stage IV Pancreatic cancer and his prognosis is a few months. She reports that he is a "wonderful person" and he is close with the entire family.   Her brother had a severe infection and was in the hospital for 3 weeks. Son signed up for 6 AP classes and initially made all A's, then developed gastritis, got behind in classes, failed some classes, "and then stopped" trying at school work and going to school consistently. She reports that some things that have negatively affected her son "will make me cry... it makes me sad."   Denies panic attacks. She reports that she has had worry and rumination at times. She reports that she was "almost obsessed" with son's school attendance and checking [employer] to see if he was at school. She has had some worry about her brother and husband who has some health issues. "I feel good maintaining."   She denies depression. She reports difficulty with sleep. She reports that she continues to use wine to help with sleep and has tried to cut back. She reports that she usually awakens during the night and then returns to sleep after a period of time. She occasionally takes another Quetiapine to return to sleep. She reports that energy and motivation has been ok. She has been setting different goals for herself. Concentration has been ok. Appetite has been ok. Denies SI.   Lunesta last filled 02/06/23.   Past medication trials: Cymbalta-helpful for depression but not anxiety Sertraline-effective for mood and anxiety. Thinks it may cause  intrusive Citalopram Wellbutrin Seroquel Trazodone-ineffective Melatonin-ineffective Benadryl-ineffective for insomnia Ambien-parasomnias Belsomra- unsure of benefit.  Trileptal-ineffective for insomnia or anxiety Gabapentin- helps with RLS Lunesta  Topamax- Prescribed for weight loss and was ineffective   AIMS    Flowsheet Row Office Visit from 02/14/2023 in [facility] Video Visit from 08/16/2022 in [facility] Office Visit from 02/14/2022 in [facility] Office Visit from 08/08/2020 in [facility] Office Visit from 11/24/2019 in [facility]  AIMS Total Score 0 0 1 1 0        Review of Systems:  Review of Systems  Musculoskeletal:  Negative for gait problem.  Neurological:  Negative for tremors.  Psychiatric/Behavioral:         Please refer to HPI    Has started seeing Bariatric provider.   Medications: I have reviewed the patient's current medications.  Current Outpatient Medications  Medication Sig Dispense Refill   amLODipine (NORVASC) 5 MG tablet Take 1 tablet (5 mg total) by mouth daily. 90 tablet 3   Cholecalciferol (VITAMIN D PO) Take 1 tablet by mouth daily.     Cyanocobalamin (B-12 PO) Take 1 tablet by mouth daily.     Eszopiclone 3 MG TABS Take 1 tablet (3 mg total) by mouth at bedtime. 30 tablet 5   losartan (COZAAR) 100 MG tablet Take 1 tablet (100 mg total) by mouth daily. 90 tablet 3   Multiple Vitamin (MULTIVITAMIN) tablet Take 1  tablet by mouth daily. Centrum Silver daily     naltrexone (DEPADE) 50 MG tablet Take by mouth.     rizatriptan (MAXALT-MLT) 10 MG disintegrating tablet Take by mouth as needed.     rosuvastatin (CRESTOR) 20 MG tablet TAKE ONE TABLET BY MOUTH DAILY. 90 tablet 3   SUMAtriptan (IMITREX) 100 MG tablet Take 1 tablet (100 mg total) by mouth once as needed for up to 1 dose for migraine. May repeat  in 2 hours if headache persists or recurs. 10 tablet 2   buPROPion (WELLBUTRIN XL) 150 MG 24 hr tablet Take 1 tablet (150 mg total) by mouth daily. 90 tablet 1   gabapentin (NEURONTIN) 300 MG capsule Take 1-2 tabs po at bedtime prn RLS 180 capsule 1   Omega-3 Fatty Acids (FISH OIL) 1000 MG CAPS Take by mouth. (Patient not taking: Reported on 02/14/2023)     QUEtiapine (SEROQUEL) 50 MG tablet Take 1-2 tablets (50-100 mg total) by mouth at bedtime. 180 tablet 0   sertraline (ZOLOFT) 100 MG tablet Take 2 tablets (200 mg total) by mouth daily. 180 tablet 2   Current Facility-Administered Medications  Medication Dose Route Frequency Provider Last Rate Last Admin   0.9 %  sodium chloride infusion  500 mL Intravenous Once Nandigam, Eleonore Chiquito, MD        Medication Side Effects: None  Allergies:  Allergies  Allergen Reactions   Sulfa Antibiotics Rash    fever    Past Medical History:  Diagnosis Date   Anemia    has received iron infusions   Anxiety    Arthritis    mid foot arthristis   Cancer (HCC)    Carpal tunnel syndrome    Headache    Hyperlipidemia    Hypertension    Insomnia    Migraine    Obstructive sleep apnea    PONV (postoperative nausea and vomiting)    Skin cancer    Skin cancer, basal cell    Sleep apnea    wears cpap   Vision loss of left eye    history of lazy eye    Past Medical History, Surgical history, Social history, and Family history were reviewed and updated as appropriate.   Please see review of systems for further details on the patient's review from today.   Objective:   Physical Exam:  There were no vitals taken for this visit.  Physical Exam Constitutional:      General: She is not in acute distress. Musculoskeletal:        General: No deformity.  Neurological:     Mental Status: She is alert and oriented to person, place, and time.     Coordination: Coordination normal.  Psychiatric:        Attention and Perception: Attention and  perception normal. She does not perceive auditory or visual hallucinations.        Mood and Affect: Mood is not depressed. Affect is not labile, blunt, angry or inappropriate.        Speech: Speech normal.        Behavior: Behavior normal.        Thought Content: Thought content normal. Thought content is not paranoid or delusional. Thought content does not include homicidal or suicidal ideation. Thought content does not include homicidal or suicidal plan.        Cognition and Memory: Cognition and memory normal.        Judgment: Judgment normal.     Comments: Insight intact  Anxious in response to family stressors     Lab Review:     Component Value Date/Time   NA 140 02/02/2022 1057   K 4.4 02/02/2022 1057   CL 102 02/02/2022 1057   CO2 28 02/02/2022 1057   GLUCOSE 105 (H) 02/02/2022 1057   GLUCOSE 85 03/29/2016 1135   BUN 12 02/02/2022 1057   CREATININE 1.02 (H) 02/02/2022 1057   CALCIUM 9.9 02/02/2022 1057   PROT 6.9 02/02/2022 1057   ALBUMIN 4.5 02/02/2022 1057   AST 24 02/02/2022 1057   ALT 26 02/02/2022 1057   ALKPHOS 97 02/02/2022 1057   BILITOT 0.4 02/02/2022 1057   GFRNONAA >60 03/29/2016 1135   GFRAA >60 03/29/2016 1135       Component Value Date/Time   WBC 5.4 02/02/2022 1057   WBC 6.8 03/29/2016 1135   RBC 4.26 02/02/2022 1057   RBC 3.84 (L) 03/29/2016 1135   HGB 12.7 02/02/2022 1057   HCT 37.3 02/02/2022 1057   PLT 256 02/02/2022 1057   MCV 88 02/02/2022 1057   MCH 29.8 02/02/2022 1057   MCH 31.0 03/29/2016 1135   MCHC 34.0 02/02/2022 1057   MCHC 34.0 03/29/2016 1135   RDW 13.6 02/02/2022 1057   LYMPHSABS 3.0 03/15/2014 0540   MONOABS 0.6 03/15/2014 0540   EOSABS 0.2 03/15/2014 0540   BASOSABS 0.0 03/15/2014 0540    No results found for: "POCLITH", "LITHIUM"   No results found for: "PHENYTOIN", "PHENOBARB", "VALPROATE", "CBMZ"   .res Assessment: Plan:   I spent 30 minutes dedicated to the care of this patient on the date of this  encounter to  include pre-visit review of records, face-to-face time with the patient discussing treatment options for insomnia, ordering of medication, and post visit documentation. She reports that she does not want to change Lunesta. Discussed that several medications for insomnia would not be safe to combine with Lunesta due to risk of CNS depression. Discussed that past trials with non-controlled medications for sleep have been ineffective and that some medications, such as Remeron, may be helpful for sleep but also have risk for weight gain. Discussed considering adding Buspar to decrease overall level of anxiety since anxiety may be contributing to sleep disturbance. Pt reports that she would like to continue current medications without changes at this time.  Continue Sertraline 200 mg daily for anxiety and depression.  Continue Seroquel 50-100 mg po QHS for off-label indication of insomnia since pt reports worsening insomnia without Seroquel. Continue Lunesta 3 mg po QHS for insomnia.  Continue Wellbutrin XL 150 mg po qd for depression.  Continue Gabapentin 300 mg 1-2 capsules at bedtime for anxiety and insomnia.  Pt to follow-up in 6 months or sooner if clinically indicated.  Patient advised to contact office with any questions, adverse effects, or acute worsening in signs and symptoms.  Gidgett was seen today for anxiety and sleeping problem.  Diagnoses and all orders for this visit:  Generalized anxiety disorder -     sertraline (ZOLOFT) 100 MG tablet; Take 2 tablets (200 mg total) by mouth daily.  Depression, unspecified depression type -     sertraline (ZOLOFT) 100 MG tablet; Take 2 tablets (200 mg total) by mouth daily. -     buPROPion (WELLBUTRIN XL) 150 MG 24 hr tablet; Take 1 tablet (150 mg total) by mouth daily.  Insomnia, unspecified type -     QUEtiapine (SEROQUEL) 50 MG tablet; Take 1-2 tablets (50-100 mg total) by mouth at bedtime. -  gabapentin (NEURONTIN) 300 MG capsule; Take 1-2  tabs po at bedtime prn RLS     Please see After Visit Summary for patient specific instructions.  Future Appointments  Date Time Provider Department Center  05/17/2023  4:20 PM Rollene Rotunda, MD [facility] None  08/16/2023 12:45 PM Corie Chiquito, PMHNP [facility] None    No orders of the defined types were placed in this encounter.   -------------------------------

## 2023-03-24 ENCOUNTER — Other Ambulatory Visit: Payer: Self-pay | Admitting: Cardiology

## 2023-04-02 ENCOUNTER — Ambulatory Visit (INDEPENDENT_AMBULATORY_CARE_PROVIDER_SITE_OTHER): Payer: Managed Care, Other (non HMO) | Admitting: Neurology

## 2023-04-02 ENCOUNTER — Encounter: Payer: Self-pay | Admitting: Neurology

## 2023-04-02 VITALS — BP 117/75 | HR 85 | Ht 63.0 in | Wt 251.0 lb

## 2023-04-02 DIAGNOSIS — G4733 Obstructive sleep apnea (adult) (pediatric): Secondary | ICD-10-CM | POA: Diagnosis not present

## 2023-04-02 DIAGNOSIS — I10 Essential (primary) hypertension: Secondary | ICD-10-CM | POA: Diagnosis not present

## 2023-04-02 DIAGNOSIS — I2510 Atherosclerotic heart disease of native coronary artery without angina pectoris: Secondary | ICD-10-CM

## 2023-04-02 DIAGNOSIS — G43009 Migraine without aura, not intractable, without status migrainosus: Secondary | ICD-10-CM | POA: Diagnosis not present

## 2023-04-02 DIAGNOSIS — R4789 Other speech disturbances: Secondary | ICD-10-CM

## 2023-04-02 MED ORDER — RIZATRIPTAN BENZOATE 10 MG PO TBDP: 10.0000 mg | ORAL_TABLET | ORAL | 5 refills | Status: DC | PRN

## 2023-04-02 NOTE — Progress Notes (Signed)
[facility]  PATIENT: Joan West DOB: 1965-11-18  REFERRING DOCTOR OR PCP:  Burnett Kanaris SOURCE: Patient, from [facility] Neurology, sleep study reports, sleep study data, download data  _________________________________   HISTORICAL  CHIEF COMPLAINT:  Chief Complaint  Patient presents with   Room 10    Pt is here Alone. Pt states that her migraines have been doing good and is well managed. Pt states that she doesn't have any complaints with her CPAP Machine. Pt states that she doesn't have any complaints to discuss today.     HISTORY OF PRESENT ILLNESS:  Joan West is a 56 y.o. woman with migraines, sleep apnea, insomnia and restless leg syndrome    Update 04/02/2023: She has OSA diagnosed in 2000.   HST 03/2020 showed severe OSA (AHI = 46).   Auto-PAP 5-10 was initiated.    She is tolerating it well.  Her download showed 97% 4 hour compliance with very good efficacy (AHI = 2.7)     She tolerates it well.   She denies excessive daytime sleepiness.   Her machine is from 2021  Her RLS improved on gabapentin.  It seems worse if she takes 100 mg quetiapine instead of 50 mg.  She also takes eszopiclone for her insomnia.    Iron has been fine so no recent infusion.    She has migraine headaches bu they are much better .   She averages < 1  migraine / month now, last one was a week ago.    She does well on rizatriptan and rarely takes a sumatriptan if the migraine is worse or if  rizatriptan was ineffective (sumatriptan not as well tolerated with HTN).     She occasionally has mild memory loss and word finding issues.   She usually remembers with a hint or with a little more time.     OSA history: Joan West was diagnosed with OSA in 2000 and started on CPAP. A PSG in 2005 showed that she had OSA with AHI equals 26 and CPAP pressure was increased from +10 cm to a pressure to +10 cm. Of note, her weight gain 30 pounds between 2000 and 2005. Again and another  40 pounds over the next 5 years. She had gastric bypass surgery in 2010 and lost 40 pounds and continued on CPAP +10.  She was re-titrated in 2010 and was placed on CPAP +11 cm and received a new machine.   I reviewed compliance data from 2013. She had 93% compliance and excellent efficacy with an AHI equals 3.9 on CPAP +11 cm.    Current weight is 223, up slightly compared to last time I saw her in 2015.     Home sleep study 04/08/2020 showed severe OSA with AHI = 46.  She was placed on AutoPap 5-20 cm.  EPWORTH SLEEPINESS SCALE  On a scale of 0 - 3 what is the chance of dozing:  Sitting and Reading:   0 Watching TV:    0 Sitting inactive in a public place: 0 Passenger in car for one hour: 2 Lying down to rest in the afternoon: 1 Sitting and talking to someone: 0 Sitting quietly after lunch:  1 In a car, stopped in traffic:  0  Total (out of 24):    4/24     REVIEW OF SYSTEMS: Constitutional: No fevers, chills, sweats, or change in appetite Eyes: No visual changes, double vision, eye pain Ear, nose and throat: No hearing loss, ear pain, nasal  congestion, sore throat Cardiovascular: No chest pain, palpitations Respiratory:  No shortness of breath at rest or with exertion.   No wheezes.  Has OSA GastrointestinaI: No nausea, vomiting, diarrhea, abdominal pain, fecal incontinence Genitourinary:  No dysuria, urinary retention or frequency.  No nocturia. Musculoskeletal:  No neck pain, back pain Integumentary: No rash, pruritus, skin lesions Neurological: as above Psychiatric: No depression at this time.  No anxiety Endocrine: No palpitations, diaphoresis, change in appetite, change in weigh or increased thirst Hematologic/Lymphatic:  No anemia now but had in past due to low iron.   No purpura, petechiae. Allergic/Immunologic: No itchy/runny eyes, nasal congestion, recent allergic reactions, rashes  ALLERGIES: Allergies  Allergen Reactions   Sulfa Antibiotics Rash    fever     HOME MEDICATIONS:  Current Outpatient Medications:    amLODipine (NORVASC) 5 MG tablet, TAKE 1 TABLET (5 MG TOTAL) BY MOUTH DAILY., Disp: 90 tablet, Rfl: 3   buPROPion (WELLBUTRIN XL) 150 MG 24 hr tablet, Take 1 tablet (150 mg total) by mouth daily., Disp: 90 tablet, Rfl: 1   Cholecalciferol (VITAMIN D PO), Take 1 tablet by mouth daily., Disp: , Rfl:    Cyanocobalamin (B-12 PO), Take 1 tablet by mouth daily., Disp: , Rfl:    Eszopiclone 3 MG TABS, Take 1 tablet (3 mg total) by mouth at bedtime., Disp: 30 tablet, Rfl: 5   gabapentin (NEURONTIN) 300 MG capsule, Take 1-2 tabs po at bedtime prn RLS, Disp: 180 capsule, Rfl: 1   losartan (COZAAR) 100 MG tablet, Take 1 tablet (100 mg total) by mouth daily., Disp: 90 tablet, Rfl: 3   Multiple Vitamin (MULTIVITAMIN) tablet, Take 1 tablet by mouth daily. Centrum Silver daily, Disp: , Rfl:    naltrexone (DEPADE) 50 MG tablet, Take by mouth., Disp: , Rfl:    Omega-3 Fatty Acids (FISH OIL) 1000 MG CAPS, Take by mouth., Disp: , Rfl:    QUEtiapine (SEROQUEL) 50 MG tablet, Take 1-2 tablets (50-100 mg total) by mouth at bedtime., Disp: 180 tablet, Rfl: 0   rosuvastatin (CRESTOR) 20 MG tablet, TAKE ONE TABLET BY MOUTH DAILY., Disp: 90 tablet, Rfl: 3   sertraline (ZOLOFT) 100 MG tablet, Take 2 tablets (200 mg total) by mouth daily., Disp: 180 tablet, Rfl: 2   SUMAtriptan (IMITREX) 100 MG tablet, Take 1 tablet (100 mg total) by mouth once as needed for up to 1 dose for migraine. May repeat in 2 hours if headache persists or recurs., Disp: 10 tablet, Rfl: 2   rizatriptan (MAXALT-MLT) 10 MG disintegrating tablet, Take 1 tablet (10 mg total) by mouth as needed., Disp: 10 tablet, Rfl: 5  Current Facility-Administered Medications:    0.9 %  sodium chloride infusion, 500 mL, Intravenous, Once, Nandigam, Kavitha V, MD  PAST MEDICAL HISTORY: Past Medical History:  Diagnosis Date   Anemia    has received iron infusions   Anxiety    Arthritis    mid foot  arthristis   Cancer (HCC)    Carpal tunnel syndrome    Headache    Hyperlipidemia    Hypertension    Insomnia    Migraine    Obstructive sleep apnea    PONV (postoperative nausea and vomiting)    Skin cancer    Skin cancer, basal cell    Sleep apnea    wears cpap   Vision loss of left eye    history of lazy eye    PAST SURGICAL HISTORY: Past Surgical History:  Procedure Laterality Date  ABLATION     APPENDECTOMY     BILATERAL CARPAL TUNNEL RELEASE     CARDIAC CATHETERIZATION     CESAREAN SECTION     x2   COLONOSCOPY     DILITATION & CURRETTAGE/HYSTROSCOPY WITH NOVASURE ABLATION N/A 04/03/2016   Procedure: DILATATION & CURETTAGE/HYSTEROSCOPY WITH NOVASURE ABLATION;  Surgeon: Olivia Mackie, MD;  Location: [facility];  Service: Gynecology;  Laterality: N/A;   GASTRIC BYPASS     POLYPECTOMY     skin cancer     SKIN CANCER EXCISION      FAMILY HISTORY: Family History  Problem Relation Age of Onset   Lung disease Mother    Schizophrenia Mother    Drug abuse Mother    Heart attack Father    Heart disease Father    Drug abuse Father    Bipolar disorder Father    Heart attack Brother    Anxiety disorder Son    Colon cancer Cousin    Colon polyps Neg Hx    Esophageal cancer Neg Hx    Rectal cancer Neg Hx    Stomach cancer Neg Hx    Crohn's disease Neg Hx     SOCIAL HISTORY:  Social History   Socioeconomic History   Marital status: Married    Spouse name: Not on file   Number of children: Not on file   Years of education: Not on file   Highest education level: Not on file  Occupational History   Not on file  Tobacco Use   Smoking status: Never    Passive exposure: Past (PARENT'S SMOKED BOTH)   Smokeless tobacco: Never  Vaping Use   Vaping status: Never Used  Substance and Sexual Activity   Alcohol use: Yes    Alcohol/week: 1.0 standard drink of alcohol    Types: 1 Glasses of wine per week    Comment: WINE 4 X PER WEEK   Drug use: No   Sexual  activity: Yes    Birth control/protection: Surgical    Comment: hx ablation  Other Topics Concern   Not on file  Social History Narrative   Not on file   Social Determinants of Health   Financial Resource Strain: Patient Declined (02/04/2023)   Received from [facility]   Overall Financial Resource Strain (CARDIA)    Difficulty of Paying Living Expenses: Patient declined  Food Insecurity: Patient Declined (02/04/2023)   Received from [facility], [facility]   Hunger Vital Sign    Worried About Running Out of Food in the Last Year: Patient declined    Ran Out of Food in the Last Year: Patient declined  Transportation Needs: Patient Declined (02/04/2023)   Received from [facility], [facility]   [facility] - Transportation    Lack of Transportation (Medical): Patient declined    Lack of Transportation (Non-Medical): Patient declined  Physical Activity: Insufficiently Active (06/26/2022)   [facility]   Exercise Vital Sign    Days of Exercise per Week: 1 day    Minutes of Exercise per Session: 30 min  Stress: Stress Concern Present (06/26/2022)   Received from [facility], [facility]   [employer] [facility] - Occupational Stress Questionnaire    Feeling of Stress : To some extent  Social Connections: Socially Integrated (06/26/2022)   Received from [facility], [facility]   Social Network    How would you rate your social network (family, work, friends)?: Good participation with social  networks  Intimate Partner Violence: Not At Risk (06/26/2022)   Received from [facility], [facility]   HITS    Over the last 12 months how often did your partner physically hurt you?: 1    Over the last 12 months how often did your partner insult you or talk down to you?: 1    Over the last 12 months how often did your partner threaten you with physical harm?: 1    Over the last 12 months how often did your  partner scream or curse at you?: 1     PHYSICAL EXAM  Vitals:   04/02/23 1111  BP: 117/75  Pulse: 85  Weight: 251 lb (113.9 kg)  Height: 5\' 3"  (1.6 m)    Body mass index is 44.46 kg/m.   General: The patient is well-developed and well-nourished and in no acute distress  HEENT: Head is normocephalic and atraumatic. Pharynx is nonerythematous.  The pharynx is Mallampati 2.  The neck is supple   Neurologic Exam  Mental status: The patient is alert and oriented x 3 at the time of the examination. The patient has apparent normal recent and remote memory, with an apparently normal attention span and concentration ability [id number].    3/3 STM     Speech is normal.  Cranial nerves: Extraocular movements are full.  Facial strength and sensation is normal.  Trapezius strength is normal..  The tongue is midline, and the patient has symmetric elevation of the soft palate. No obvious hearing deficits are noted.  Motor: Muscle bulk is normal.  Muscle tone is normal.  Strength is 5/5.  Sensory: She has intact sensation to touch in the arms and legs..  Coordination: Finger-nose-finger and heel-to-shin is performed well.  Gait and station: Station is normal.   Gait is normal and  tandem gait mildly wide.  Romberg is negative.  Reflexes: Deep tendon reflexes are symmetric and normal bilaterally.    Marland Kitchen    DIAGNOSTIC DATA (LABS, IMAGING, TESTING) - I reviewed patient records, labs, notes, testing and imaging myself where available.  Lab Results  Component Value Date   WBC 5.4 02/02/2022   HGB 12.7 02/02/2022   HCT 37.3 02/02/2022   MCV 88 02/02/2022   PLT 256 02/02/2022      Component Value Date/Time   NA 140 02/02/2022 1057   K 4.4 02/02/2022 1057   CL 102 02/02/2022 1057   CO2 28 02/02/2022 1057   GLUCOSE 105 (H) 02/02/2022 1057   GLUCOSE 85 03/29/2016 1135   BUN 12 02/02/2022 1057   CREATININE 1.02 (H) 02/02/2022 1057   CALCIUM 9.9 02/02/2022 1057   PROT 6.9  02/02/2022 1057   ALBUMIN 4.5 02/02/2022 1057   AST 24 02/02/2022 1057   ALT 26 02/02/2022 1057   ALKPHOS 97 02/02/2022 1057   BILITOT 0.4 02/02/2022 1057   GFRNONAA >60 03/29/2016 1135   GFRAA >60 03/29/2016 1135       ASSESSMENT AND PLAN  Common migraine without intractability  OSA on CPAP  Coronary artery disease involving native heart without angina pectoris, unspecified vessel or lesion type  Essential hypertension   1.   Use CPAP nightly.  We will continue the current settings.  Download shows excellent compliance and efficacy..  We discussed Earnest Bailey (she brought up).  Due to high BMI, she is not a candidate. 2.   Continue Maxalt for migraine.   Can have Imitrex 2 hours later if headache is not better (does not tolerate it  as well) 3.   She will return to see me in 12 months or sooner if there are new or worsening neurologic symptoms.   Abriana Saltos A. Epimenio Foot, MD, PhD 04/02/2023, 11:34 AM Certified in Neurology, Clinical Neurophysiology, Sleep Medicine, Pain Medicine and Neuroimaging  [facility] [address], [address] [place], [place] [address] [phone]

## 2023-05-17 ENCOUNTER — Ambulatory Visit: Payer: Managed Care, Other (non HMO) | Admitting: Cardiology

## 2023-06-25 ENCOUNTER — Other Ambulatory Visit: Payer: Self-pay | Admitting: Cardiology

## 2023-06-25 ENCOUNTER — Encounter: Payer: Self-pay | Admitting: Neurology

## 2023-07-01 ENCOUNTER — Other Ambulatory Visit: Payer: Self-pay

## 2023-07-01 DIAGNOSIS — E785 Hyperlipidemia, unspecified: Secondary | ICD-10-CM

## 2023-07-01 DIAGNOSIS — I2510 Atherosclerotic heart disease of native coronary artery without angina pectoris: Secondary | ICD-10-CM

## 2023-07-01 DIAGNOSIS — I10 Essential (primary) hypertension: Secondary | ICD-10-CM

## 2023-07-03 ENCOUNTER — Encounter: Payer: Self-pay | Admitting: Psychiatry

## 2023-07-03 LAB — CBC
Hematocrit: 39.1 % (ref 34.0–46.6)
Hemoglobin: 12.6 g/dL (ref 11.1–15.9)
MCH: 29.9 pg (ref 26.6–33.0)
MCHC: 32.2 g/dL (ref 31.5–35.7)
MCV: 93 fL (ref 79–97)
Platelets: 235 10*3/uL (ref 150–450)
RBC: 4.21 x10E6/uL (ref 3.77–5.28)
RDW: 13.4 % (ref 11.7–15.4)
WBC: 6.2 10*3/uL (ref 3.4–10.8)

## 2023-07-03 LAB — LIPID PANEL
Chol/HDL Ratio: 2 ratio (ref 0.0–4.4)
Cholesterol, Total: 180 mg/dL (ref 100–199)
HDL: 88 mg/dL (ref >39)
LDL Chol Calc (NIH): 74 mg/dL (ref 0–99)
Triglycerides: 106 mg/dL (ref 0–149)
VLDL Cholesterol Cal: 18 mg/dL (ref 5–40)

## 2023-07-03 LAB — COMPREHENSIVE METABOLIC PANEL
ALT: 29 IU/L (ref 0–32)
AST: 24 IU/L (ref 0–40)
Albumin: 4.4 g/dL (ref 3.8–4.9)
Alkaline Phosphatase: 104 [IU]/L (ref 44–121)
BUN/Creatinine Ratio: 16 (ref 9–23)
BUN: 15 mg/dL (ref 6–24)
Bilirubin Total: 0.3 mg/dL (ref 0.0–1.2)
CO2: 23 mmol/L (ref 20–29)
Calcium: 9.3 mg/dL (ref 8.7–10.2)
Chloride: 103 mmol/L (ref 96–106)
Creatinine, Ser: 0.94 mg/dL (ref 0.57–1.00)
Globulin, Total: 2.3 g/dL (ref 1.5–4.5)
Glucose: 85 mg/dL (ref 70–99)
Potassium: 4.4 mmol/L (ref 3.5–5.2)
Sodium: 139 mmol/L (ref 134–144)
Total Protein: 6.7 g/dL (ref 6.0–8.5)
eGFR: 71 mL/min/{1.73_m2} (ref >59)

## 2023-07-03 LAB — TSH: TSH: 1.15 u[IU]/mL (ref 0.450–4.500)

## 2023-07-03 LAB — LIPOPROTEIN A (LPA): Lipoprotein (a): 68.8 nmol/L (ref <75.0)

## 2023-07-03 LAB — HEMOGLOBIN A1C
Est. average glucose Bld gHb Est-mCnc: 111 mg/dL
Hgb A1c MFr Bld: 5.5 % (ref 4.8–5.6)

## 2023-07-11 NOTE — Progress Notes (Signed)
  Cardiology Office Note:   Date:  07/12/2023  ID:  Joan West, DOB [birth year], MRN [id number] PCP: Gillian Scarce  [facility] Providers Cardiologist:  Rollene Rotunda, MD {  History of Present Illness:   Joan West is a 57 y.o. female who presents for evaluation of CAD and HTN.  She has a very strong family history of obstructive coronary disease with her father dying at age 37 with coronary disease and her brother already has an LVAD.  She was seeing another cardiology group in the region.  She actually had a stress echocardiogram which I think was equivocal.  I do see a cardiac catheterization in July 2020 that demonstrated minor luminal irregularities in multiple vessels with the most focal lesion being a 20 to 30% mid circumflex stenosis.  Since I last saw her she has done well    The patient denies any new symptoms such as chest discomfort, neck or arm discomfort. There has been no new shortness of breath, PND or orthopnea. There have been no reported palpitations, presyncope or syncope.    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  She does a lot of walking at work.       ROS: As stated in the HPI and negative for all other systems.  Studies Reviewed:    EKG:   EKG Interpretation Date/Time:  Friday July 12 2023 16:32:48 EST Ventricular Rate:  75 PR Interval:  164 QRS Duration:  92 QT Interval:  388 QTC Calculation: 433 R Axis:   7  Text Interpretation: Normal sinus rhythm When compared with ECG of 29-Mar-2016 11:34, No significant change was found Confirmed by Rollene Rotunda ([id number]) on 07/12/2023 5:09:37 PM    Risk Assessment/Calculations:         Physical Exam:   VS:  BP (!) 160/90 (BP Location: Left Arm, Patient Position: Sitting, Cuff Size: Normal)   Pulse 77   Ht 5\' 3"  (1.6 m)   Wt 242 lb (109.8 kg)   SpO2 97%   BMI 42.87 kg/m    Wt Readings from Last 3 Encounters:  07/12/23 242 lb (109.8 kg)   04/02/23 251 lb (113.9 kg)  03/14/22 237 lb (107.5 kg)     GEN: Well nourished, well developed in no acute distress NECK: No JVD; No carotid bruits CARDIAC: RRR, no murmurs, rubs, gallops RESPIRATORY:  Clear to auscultation without rales, wheezing or rhonchi  ABDOMEN: Soft, non-tender, non-distended EXTREMITIES:  No edema; No deformity   ASSESSMENT AND PLAN:   NON OBSTRUCTIVE CAD:   The patient has no new sypmtoms.  No further cardiovascular testing is indicated.  We will continue with aggressive risk reduction and meds as listed.  HTN: Her blood pressure is elevated but this is unusual.  She is going to keep a blood pressure diary and I will need to titrate her meds pending the results of this.   DYSLIPIDEMIA: Her lipids are at target.  No change in therapy.    MORBID OBESITY:    She is now on compounded GLP1 ra.        Follow up with me in one year.  Tested taking  Signed, Rollene Rotunda, MD

## 2023-07-12 ENCOUNTER — Encounter: Payer: Self-pay | Admitting: Cardiology

## 2023-07-12 ENCOUNTER — Ambulatory Visit: Payer: Managed Care, Other (non HMO) | Attending: Cardiology | Admitting: Cardiology

## 2023-07-12 VITALS — BP 160/90 | HR 77 | Ht 63.0 in | Wt 242.0 lb

## 2023-07-12 DIAGNOSIS — E785 Hyperlipidemia, unspecified: Secondary | ICD-10-CM | POA: Diagnosis not present

## 2023-07-12 DIAGNOSIS — I10 Essential (primary) hypertension: Secondary | ICD-10-CM

## 2023-07-12 DIAGNOSIS — I2510 Atherosclerotic heart disease of native coronary artery without angina pectoris: Secondary | ICD-10-CM

## 2023-07-12 NOTE — Patient Instructions (Addendum)
Medication Instructions:  Your physician recommends that you continue on your current medications as directed. Please refer to the Current Medication list given to you today.  *If you need a refill on your cardiac medications before your next appointment, please call your pharmacy*  Follow-Up: At [facility], you and your health needs are our priority.  As part of our continuing mission to provide you with exceptional heart care, we have created designated Provider Care Teams.  These Care Teams include your primary Cardiologist (physician) and Advanced Practice Providers (APPs -  Physician Assistants and Nurse Practitioners) who all work together to provide you with the care you need, when you need it.  Your next appointment:   12 month(s)  Provider:   Rollene Rotunda, MD     Other Instructions Please keep a BP log and send [place] a message either in My Chart or call us with your numbers.  HOW TO TAKE YOUR BLOOD PRESSURE: Rest 10-15 minutes before taking your blood pressure. Don't smoke or drink caffeinated beverages for at least 30 minutes before. Take your blood pressure before (not after) you eat. Sit comfortably with your back supported and both feet on the floor (don't cross your legs). Elevate your arm to heart level on a table or a desk. Use the proper sized cuff. It should fit smoothly and snugly around your bare upper arm. There should be enough room to slip a fingertip under the cuff. The bottom edge of the cuff should be 1 inch above the crease of the elbow.

## 2023-07-17 ENCOUNTER — Other Ambulatory Visit: Payer: Self-pay | Admitting: Cardiology

## 2023-07-25 ENCOUNTER — Ambulatory Visit: Payer: Managed Care, Other (non HMO) | Admitting: Neurology

## 2023-07-25 ENCOUNTER — Encounter: Payer: Self-pay | Admitting: Neurology

## 2023-07-25 VITALS — BP 160/84 | HR 76 | Ht 63.0 in | Wt 240.5 lb

## 2023-07-25 DIAGNOSIS — G4700 Insomnia, unspecified: Secondary | ICD-10-CM | POA: Diagnosis not present

## 2023-07-25 DIAGNOSIS — M5412 Radiculopathy, cervical region: Secondary | ICD-10-CM

## 2023-07-25 DIAGNOSIS — G2581 Restless legs syndrome: Secondary | ICD-10-CM

## 2023-07-25 DIAGNOSIS — G43009 Migraine without aura, not intractable, without status migrainosus: Secondary | ICD-10-CM | POA: Diagnosis not present

## 2023-07-25 DIAGNOSIS — G4733 Obstructive sleep apnea (adult) (pediatric): Secondary | ICD-10-CM

## 2023-07-25 MED ORDER — GABAPENTIN 300 MG PO CAPS
ORAL_CAPSULE | ORAL | 3 refills | Status: AC
Start: 2023-07-25 — End: ?

## 2023-07-25 MED ORDER — METHYLPREDNISOLONE 4 MG PO TABS: ORAL_TABLET | ORAL | 0 refills | Status: AC

## 2023-07-25 NOTE — Progress Notes (Signed)
[facility]  PATIENT: Joan West DOB: [birth year]  REFERRING DOCTOR OR PCP:  Burnett Kanaris SOURCE: Patient, from [facility] Neurology, sleep study reports, sleep study data, download data  _________________________________   HISTORICAL  CHIEF COMPLAINT:  Chief Complaint  Patient presents with   Follow-up    Pt in room 11. Here for pins and needles sensation from left shoulder down to hand, only left side, for about 2 months now, symptoms has worsened. No numbness in left hand.  Blood pressure elevated x 2.     HISTORY OF PRESENT ILLNESS:  Joan West is a 57 y.o. woman with migraines, sleep apnea, insomnia and restless leg syndrome    Update 07/25/2023: She is having pins and needles sensation in the left arm.  She has some left neck discomfort.  When more intense, symptoms go into her first 3 digits.   Hanging her arm down increases the likelihood of it happening.  It eventually dissipates but shaking the rm/hand does not help.    She notes a little forearm weakness.  She had an xray at a chiropractor recently but not sure of results.    She has OSA diagnosed in 2000.   HST 03/2020 showed severe OSA (AHI = 46).   Auto-PAP 5-10 was initiated and she is tolerating it well.  She uses it nightly for the whole night.  A prior download showed very good efficacy (AHI = 2.7)     She tolerates it well.   She denies excessive daytime sleepiness.   Her machine is from 2021  Her RLS improved on gabapentin.  It seems worse if she takes 100 mg quetiapine instead of 50 mg.  She also takes eszopiclone for her insomnia.    Iron has been fine so no recent infusion.    She has migraine headaches bu they are much better .   She averages < 1  migraine / month now, last one was a week ago.    She does well on rizatriptan and rarely takes a sumatriptan if the migraine is worse or if  rizatriptan was ineffective (sumatriptan not as well tolerated with HTN).     She  occasionally has mild memory loss and word finding issues.   She usually remembers with a hint or with a little more time.     OSA history: [name] was diagnosed with OSA in 2000 and started on CPAP. A PSG in 2005 showed that she had OSA with AHI equals 26 and CPAP pressure was increased from +10 cm to a pressure to +10 cm. Of note, her weight gain 30 pounds between 2000 and 2005. Again and another 40 pounds over the next 5 years. She had gastric bypass surgery in 2010 and lost 40 pounds and continued on CPAP +10.  She was re-titrated in 2010 and was placed on CPAP +11 cm and received a new machine.   I reviewed compliance data from 2013. She had 93% compliance and excellent efficacy with an AHI equals 3.9 on CPAP +11 cm.    Current weight is 223, up slightly compared to last time I saw her in 2015.     Home sleep study 04/08/2020 showed severe OSA with AHI = 46.  She was placed on AutoPap 5-20 cm.  EPWORTH SLEEPINESS SCALE  On a scale of 0 - 3 what is the chance of dozing:  Sitting and Reading:   0 Watching TV:    0 Sitting inactive in a public place: 0  Passenger in car for one hour: 2 Lying down to rest in the afternoon: 1 Sitting and talking to someone: 0 Sitting quietly after lunch:  1 In a car, stopped in traffic:  0  Total (out of 24):    4/24     REVIEW OF SYSTEMS: Constitutional: No fevers, chills, sweats, or change in appetite Eyes: No visual changes, double vision, eye pain Ear, nose and throat: No hearing loss, ear pain, nasal congestion, sore throat Cardiovascular: No chest pain, palpitations Respiratory:  No shortness of breath at rest or with exertion.   No wheezes.  Has OSA GastrointestinaI: No nausea, vomiting, diarrhea, abdominal pain, fecal incontinence Genitourinary:  No dysuria, urinary retention or frequency.  No nocturia. Musculoskeletal:  No neck pain, back pain Integumentary: No rash, pruritus, skin lesions Neurological: as above Psychiatric: No depression at  this time.  No anxiety Endocrine: No palpitations, diaphoresis, change in appetite, change in weigh or increased thirst Hematologic/Lymphatic:  No anemia now but had in past due to low iron.   No purpura, petechiae. Allergic/Immunologic: No itchy/runny eyes, nasal congestion, recent allergic reactions, rashes  ALLERGIES: Allergies  Allergen Reactions   Sulfa Antibiotics Rash    fever    HOME MEDICATIONS:  Current Outpatient Medications:    amLODipine (NORVASC) 5 MG tablet, TAKE 1 TABLET (5 MG TOTAL) BY MOUTH DAILY., Disp: 90 tablet, Rfl: 3   buPROPion (WELLBUTRIN XL) 150 MG 24 hr tablet, Take 1 tablet (150 mg total) by mouth daily., Disp: 90 tablet, Rfl: 1   Cholecalciferol (VITAMIN D PO), Take 1 tablet by mouth daily., Disp: , Rfl:    Eszopiclone 3 MG TABS, Take 1 tablet (3 mg total) by mouth at bedtime., Disp: 30 tablet, Rfl: 5   losartan (COZAAR) 100 MG tablet, TAKE 1 TABLET BY MOUTH EVERY DAY, Disp: 90 tablet, Rfl: 1   methylPREDNISolone (MEDROL) 4 MG tablet, Taper from 6 pills po for one day to 1 pill po the last day over 6 days, Disp: 21 tablet, Rfl: 0   Multiple Vitamin (MULTIVITAMIN) tablet, Take 1 tablet by mouth daily. Centrum Silver daily, Disp: , Rfl:    QUEtiapine (SEROQUEL) 50 MG tablet, Take 1-2 tablets (50-100 mg total) by mouth at bedtime., Disp: 180 tablet, Rfl: 0   rizatriptan (MAXALT-MLT) 10 MG disintegrating tablet, Take 1 tablet (10 mg total) by mouth as needed., Disp: 10 tablet, Rfl: 5   rosuvastatin (CRESTOR) 20 MG tablet, TAKE ONE TABLET BY MOUTH DAILY., Disp: 90 tablet, Rfl: 3   SEMAGLUTIDE, 2 MG/DOSE, [place], Inject 2 mg into the skin once a week., Disp: , Rfl:    sertraline (ZOLOFT) 100 MG tablet, Take 2 tablets (200 mg total) by mouth daily., Disp: 180 tablet, Rfl: 2   SUMAtriptan (IMITREX) 100 MG tablet, Take 1 tablet (100 mg total) by mouth once as needed for up to 1 dose for migraine. May repeat in 2 hours if headache persists or recurs., Disp: 10 tablet, Rfl:  2   Cyanocobalamin (B-12 PO), Take 1 tablet by mouth daily., Disp: , Rfl:    gabapentin (NEURONTIN) 300 MG capsule, One po qAM, one po q Evening and two po qHS, Disp: 360 capsule, Rfl: 3   naltrexone (DEPADE) 50 MG tablet, Take by mouth. (Patient not taking: Reported on 07/12/2023), Disp: , Rfl:    Omega-3 Fatty Acids (FISH OIL) 1000 MG CAPS, Take by mouth., Disp: , Rfl:   Current Facility-Administered Medications:    0.9 %  sodium chloride infusion, 500 mL,  Intravenous, Once, Nandigam, Eleonore Chiquito, MD  PAST MEDICAL HISTORY: Past Medical History:  Diagnosis Date   Anemia    has received iron infusions   Anxiety    Arthritis    mid foot arthristis   Cancer (HCC)    Carpal tunnel syndrome    Headache    Hyperlipidemia    Hypertension    Insomnia    Migraine    Obstructive sleep apnea    PONV (postoperative nausea and vomiting)    Skin cancer    Skin cancer, basal cell    Sleep apnea    wears cpap   Vision loss of left eye    history of lazy eye    PAST SURGICAL HISTORY: Past Surgical History:  Procedure Laterality Date   ABLATION     APPENDECTOMY     BILATERAL CARPAL TUNNEL RELEASE     CARDIAC CATHETERIZATION     CESAREAN SECTION     x2   COLONOSCOPY     DILITATION & CURRETTAGE/HYSTROSCOPY WITH NOVASURE ABLATION N/A 04/03/2016   Procedure: DILATATION & CURETTAGE/HYSTEROSCOPY WITH NOVASURE ABLATION;  Surgeon: Olivia Mackie, MD;  Location: [facility];  Service: Gynecology;  Laterality: N/A;   GASTRIC BYPASS     POLYPECTOMY     skin cancer     SKIN CANCER EXCISION      FAMILY HISTORY: Family History  Problem Relation Age of Onset   Lung disease Mother    Schizophrenia Mother    Drug abuse Mother    Heart attack Father    Heart disease Father    Drug abuse Father    Bipolar disorder Father    Heart attack Brother    Anxiety disorder Son    Colon cancer Cousin    Colon polyps Neg Hx    Esophageal cancer Neg Hx    Rectal cancer Neg Hx    Stomach cancer Neg Hx     Crohn's disease Neg Hx     SOCIAL HISTORY:  Social History   Socioeconomic History   Marital status: Married    Spouse name: Not on file   Number of children: Not on file   Years of education: Not on file   Highest education level: Not on file  Occupational History   Not on file  Tobacco Use   Smoking status: Never    Passive exposure: Past (PARENT'S SMOKED BOTH)   Smokeless tobacco: Never  Vaping Use   Vaping status: Never Used  Substance and Sexual Activity   Alcohol use: Yes    Alcohol/week: 1.0 standard drink of alcohol    Types: 1 Glasses of wine per week    Comment: WINE 4 X PER WEEK   Drug use: No   Sexual activity: Yes    Birth control/protection: Surgical    Comment: hx ablation  Other Topics Concern   Not on file  Social History Narrative   Not on file   Social Determinants of Health   Financial Resource Strain: Patient Declined (02/04/2023)   Received from [facility], [facility]   Overall Financial Resource Strain (CARDIA)    Difficulty of Paying Living Expenses: Patient declined  Food Insecurity: Patient Declined (02/04/2023)   Received from [facility], [facility]   Hunger Vital Sign    Worried About Running Out of Food in the Last Year: Patient declined    Ran Out of Food in the Last Year: Patient declined  Transportation Needs: Patient Declined (02/04/2023)   Received from [facility],  [facility]   PRAPARE - Transportation    Lack of Transportation (Medical): Patient declined    Lack of Transportation (Non-Medical): Patient declined  Physical Activity: Insufficiently Active (06/26/2022)   Received from [facility], [facility]   Exercise Vital Sign    Days of Exercise per Week: 1 day    Minutes of Exercise per Session: 30 min  Stress: Stress Concern Present (06/26/2022)   Received from [facility], [facility] - Occupational Stress Questionnaire    Feeling of Stress : To some  extent  Social Connections: Socially Integrated (06/26/2022)   Received from [facility], [facility]   Social Network    How would you rate your social network (family, work, friends)?: Good participation with social networks  Intimate Partner Violence: Not At Risk (06/26/2022)   Received from [facility], [facility]   HITS    Over the last 12 months how often did your partner physically hurt you?: Never    Over the last 12 months how often did your partner insult you or talk down to you?: Never    Over the last 12 months how often did your partner threaten you with physical harm?: Never    Over the last 12 months how often did your partner scream or curse at you?: Never     PHYSICAL EXAM  Vitals:   07/25/23 1316 07/25/23 1321  BP: (!) 179/106 (!) 160/84  Pulse: 76   Weight: 240 lb 8 oz (109.1 kg)   Height: 5\' 3"  (1.6 m)     Body mass index is 42.6 kg/m.   General: The patient is well-developed and well-nourished and in no acute distress  HEENT: Head is normocephalic and atraumatic. Pharynx is nonerythematous.  The pharynx is Mallampati 2.  The neck is supple   Neurologic Exam  Mental status: The patient is alert and oriented x 3 at the time of the examination. The patient has apparent normal recent and remote memory, with an apparently normal attention span and concentration ability 100-93-86-79-72-65.    3/3 STM     Speech is normal.  Cranial nerves: Extraocular movements are full.  Facial strength and sensation is normal.  Trapezius strength is normal..  The tongue is midline, and the patient has symmetric elevation of the soft palate. No obvious hearing deficits are noted.  Motor: Muscle bulk is normal.  Muscle tone is normal.  Strength is 4++/5 in the left biceps and finger flexors and pronators.  Strength is 5/5 elsewhere..  Sensory: She has intact sensation to touch in the arms and legs..  Coordination: Finger-nose-finger and heel-to-shin is performed  well.  Gait and station: Station is normal.   Gait is normal and  tandem gait mildly wide.  Romberg is negative.  Reflexes: Deep tendon reflexes are symmetric and normal bilaterally.    Marland Kitchen    DIAGNOSTIC DATA (LABS, IMAGING, TESTING) - I reviewed patient records, labs, notes, testing and imaging myself where available.  Lab Results  Component Value Date   WBC 6.2 07/01/2023   HGB 12.6 07/01/2023   HCT 39.1 07/01/2023   MCV 93 07/01/2023   PLT 235 07/01/2023      Component Value Date/Time   NA 139 07/01/2023 1646   K 4.4 07/01/2023 1646   CL 103 07/01/2023 1646   CO2 23 07/01/2023 1646   GLUCOSE 85 07/01/2023 1646   GLUCOSE 85 03/29/2016 1135   BUN 15 07/01/2023 1646   CREATININE  0.94 07/01/2023 1646   CALCIUM 9.3 07/01/2023 1646   PROT 6.7 07/01/2023 1646   ALBUMIN 4.4 07/01/2023 1646   AST 24 07/01/2023 1646   ALT 29 07/01/2023 1646   ALKPHOS 104 07/01/2023 1646   BILITOT 0.3 07/01/2023 1646   GFRNONAA >60 03/29/2016 1135   GFRAA >60 03/29/2016 1135       ASSESSMENT AND PLAN  Cervical radiculopathy  Insomnia, unspecified type - Plan: gabapentin (NEURONTIN) 300 MG capsule  OSA on CPAP  Common migraine without intractability  Restless leg syndrome   1.  She likely has a cervical radiculopathy, probably C6 on the left.  I will have her take a steroid pack and we will increase her gabapentin from 2 pills at night to 4 pills split over the day.  If she continues to have symptoms in a month or so that we will need to check an MRI of the cervical spine to further evaluate and determine if she needs referral for an injection or surgery.   2.   Use CPAP nightly.  We will continue the current settings.  Download last visit showed excellent compliance and efficacy..  3.   Continue Maxalt for migraine.   Can have Imitrex 2 hours later if headache is not better (does not tolerate it as well) 4.   She will return to see me in 12 months or sooner if there are new or  worsening neurologic symptoms.   Wright Gravely A. Epimenio Foot, MD, PhD 07/25/2023, 2:09 PM Certified in Neurology, Clinical Neurophysiology, Sleep Medicine, Pain Medicine and Neuroimaging  [facility] [address], [address] [place], [place] [address] [phone]

## 2023-07-27 ENCOUNTER — Other Ambulatory Visit: Payer: Self-pay | Admitting: Psychiatry

## 2023-07-27 DIAGNOSIS — G4700 Insomnia, unspecified: Secondary | ICD-10-CM

## 2023-07-29 NOTE — Telephone Encounter (Signed)
Pt called today to check on the status of the refill

## 2023-07-31 ENCOUNTER — Encounter: Payer: Self-pay | Admitting: Cardiology

## 2023-08-16 ENCOUNTER — Telehealth (INDEPENDENT_AMBULATORY_CARE_PROVIDER_SITE_OTHER): Payer: 59 | Admitting: Psychiatry

## 2023-08-16 ENCOUNTER — Encounter: Payer: Self-pay | Admitting: Psychiatry

## 2023-08-16 DIAGNOSIS — G4700 Insomnia, unspecified: Secondary | ICD-10-CM

## 2023-08-16 DIAGNOSIS — F411 Generalized anxiety disorder: Secondary | ICD-10-CM

## 2023-08-16 DIAGNOSIS — F32A Depression, unspecified: Secondary | ICD-10-CM

## 2023-08-16 MED ORDER — SERTRALINE HCL 100 MG PO TABS: 200.0000 mg | ORAL_TABLET | Freq: Every day | ORAL | 2 refills | Status: DC

## 2023-08-16 MED ORDER — ESZOPICLONE 3 MG PO TABS: 3.0000 mg | ORAL_TABLET | Freq: Every day | ORAL | 3 refills | Status: DC

## 2023-08-16 MED ORDER — QUETIAPINE FUMARATE 50 MG PO TABS: 50.0000 mg | ORAL_TABLET | Freq: Every day | ORAL | 1 refills | Status: DC

## 2023-08-16 MED ORDER — BUPROPION HCL ER (XL) 150 MG PO TB24: 150.0000 mg | ORAL_TABLET | Freq: Every day | ORAL | 1 refills | Status: DC

## 2023-08-16 NOTE — Progress Notes (Signed)
Joan West [id number] Jun 21, 1966 57 y.o.  Virtual Visit via Video Note  I connected with pt @ on 08/16/23 at 12:45 PM EST by a video enabled telemedicine application and verified that I am speaking with the correct person using two identifiers.   I discussed the limitations of evaluation and management by telemedicine and the availability of in person appointments. The patient expressed understanding and agreed to proceed.  I discussed the assessment and treatment plan with the patient. The patient was provided an opportunity to ask questions and all were answered. The patient agreed with the plan and demonstrated an understanding of the instructions.   The patient was advised to call back or seek an in-person evaluation if the symptoms worsen or if the condition fails to improve as anticipated.  I provided 28 minutes of non-face-to-face time during this encounter.  The patient was located at home.  The provider was located at home.   Corie Chiquito, PMHNP   Subjective:   Patient ID:  Joan West is a 57 y.o. (DOB September 15, 1965) female.  Chief Complaint:  Chief Complaint  Patient presents with   Follow-up    Anxiety, depression, and insomnia    HPI Joan West presents for follow-up of depression, anxiety, and insomnia. She reports that she has had some family stressors. She reports, "no major disruptions in how I feel." Denies depression. Anxiety has been "ok." Sleep has been adequate. Energy and motivation have been good. Concentration has been ok. She reports decreased appetite with Semaglutide and has lost 17 lbs. Denies SI.   She has been offered a new job with [employer] and will likely be starting in February. She reports some stress with current job. She reports new job will be closer. Son is a Holiday representative in high school. Daughter is 71 yo and doing well. Brother continues to have health issues and she has been trying to help him.   She denies any change in ETOH  use. She usually has 2 drinks of port wine nightly.   Past medication trials: Cymbalta-helpful for depression but not anxiety Sertraline-effective for mood and anxiety. Thinks it may cause intrusive Citalopram Wellbutrin Seroquel Trazodone-ineffective Melatonin-ineffective Benadryl-ineffective for insomnia Ambien-parasomnias Belsomra- unsure of benefit.  Trileptal-ineffective for insomnia or anxiety Gabapentin- helps with RLS Lunesta  Topamax- Prescribed for weight loss and was ineffective    Review of Systems:  Review of Systems  Gastrointestinal: Negative.   Musculoskeletal:  Negative for gait problem.  Neurological:  Negative for tremors.       Tingling in her arms and is seeing a chiropractor for this.  Psychiatric/Behavioral:         Please refer to HPI    Medications: I have reviewed the patient's current medications.  Current Outpatient Medications  Medication Sig Dispense Refill   amLODipine (NORVASC) 5 MG tablet TAKE 1 TABLET (5 MG TOTAL) BY MOUTH DAILY. 90 tablet 3   Cholecalciferol (VITAMIN D PO) Take 1 tablet by mouth daily.     Eszopiclone 3 MG TABS TAKE 1 TABLET BY MOUTH AT BEDTIME. 30 tablet 2   [START ON 11/04/2023] Eszopiclone 3 MG TABS Take 1 tablet (3 mg total) by mouth at bedtime. Take immediately before bedtime 30 tablet 3   gabapentin (NEURONTIN) 300 MG capsule One po qAM, one po q Evening and two po qHS (Patient taking differently: Take 600 mg by mouth at bedtime. One po qAM, one po q Evening and two po qHS) 360 capsule 3  losartan (COZAAR) 100 MG tablet TAKE 1 TABLET BY MOUTH EVERY DAY 90 tablet 1   Multiple Vitamin (MULTIVITAMIN) tablet Take 1 tablet by mouth daily. Centrum Silver daily     rizatriptan (MAXALT-MLT) 10 MG disintegrating tablet Take 1 tablet (10 mg total) by mouth as needed. 10 tablet 5   rosuvastatin (CRESTOR) 20 MG tablet TAKE ONE TABLET BY MOUTH DAILY. 90 tablet 3   SEMAGLUTIDE, 2 MG/DOSE, [place] Inject 2 mg into the skin once a week.      SUMAtriptan (IMITREX) 100 MG tablet Take 1 tablet (100 mg total) by mouth once as needed for up to 1 dose for migraine. May repeat in 2 hours if headache persists or recurs. 10 tablet 2   buPROPion (WELLBUTRIN XL) 150 MG 24 hr tablet Take 1 tablet (150 mg total) by mouth daily. 90 tablet 1   methylPREDNISolone (MEDROL) 4 MG tablet Taper from 6 pills po for one day to 1 pill po the last day over 6 days (Patient not taking: Reported on 08/16/2023) 21 tablet 0   naltrexone (DEPADE) 50 MG tablet Take by mouth. (Patient not taking: Reported on 08/16/2023)     QUEtiapine (SEROQUEL) 50 MG tablet Take 1-2 tablets (50-100 mg total) by mouth at bedtime. 180 tablet 1   sertraline (ZOLOFT) 100 MG tablet Take 2 tablets (200 mg total) by mouth daily. 180 tablet 2   Current Facility-Administered Medications  Medication Dose Route Frequency Provider Last Rate Last Admin   0.9 %  sodium chloride infusion  500 mL Intravenous Once Nandigam, Eleonore Chiquito, MD        Medication Side Effects: None  Allergies:  Allergies  Allergen Reactions   Sulfa Antibiotics Rash    fever    Past Medical History:  Diagnosis Date   Anemia    has received iron infusions   Anxiety    Arthritis    mid foot arthristis   Cancer (HCC)    Carpal tunnel syndrome    Headache    Hyperlipidemia    Hypertension    Insomnia    Migraine    Obstructive sleep apnea    PONV (postoperative nausea and vomiting)    Skin cancer    Skin cancer, basal cell    Sleep apnea    wears cpap   Vision loss of left eye    history of lazy eye    Family History  Problem Relation Age of Onset   Lung disease Mother    Schizophrenia Mother    Drug abuse Mother    Heart attack Father    Heart disease Father    Drug abuse Father    Bipolar disorder Father    Heart attack Brother    Anxiety disorder Son    Colon cancer Cousin    Colon polyps Neg Hx    Esophageal cancer Neg Hx    Rectal cancer Neg Hx    Stomach cancer Neg Hx    Crohn's  disease Neg Hx     Social History   Socioeconomic History   Marital status: Married    Spouse name: Not on file   Number of children: Not on file   Years of education: Not on file   Highest education level: Not on file  Occupational History   Not on file  Tobacco Use   Smoking status: Never    Passive exposure: Past (PARENT'S SMOKED BOTH)   Smokeless tobacco: Never  Vaping Use   Vaping status: Never Used  Substance and Sexual Activity   Alcohol use: Yes    Alcohol/week: 1.0 standard drink of alcohol    Types: 1 Glasses of wine per week    Comment: WINE 4 X PER WEEK   Drug use: No   Sexual activity: Yes    Birth control/protection: Surgical    Comment: hx ablation  Other Topics Concern   Not on file  Social History Narrative   Not on file   Social Drivers of Health   Financial Resource Strain: Patient Declined (02/04/2023)   Received from [facility], [facility]   Overall Financial Resource Strain (CARDIA)    Difficulty of Paying Living Expenses: Patient declined  Food Insecurity: Patient Declined (02/04/2023)   Received from [facility], [facility]   Hunger Vital Sign    Worried About Running Out of Food in the Last Year: Patient declined    Ran Out of Food in the Last Year: Patient declined  Transportation Needs: Patient Declined (02/04/2023)   Received from [facility], [facility]   [facility] - Transportation    Lack of Transportation (Medical): Patient declined    Lack of Transportation (Non-Medical): Patient declined  Physical Activity: Insufficiently Active (06/26/2022)   Received from [facility], [facility]   Exercise Vital Sign    Days of Exercise per Week: 1 day    Minutes of Exercise per Session: 30 min  Stress: Stress Concern Present (06/26/2022)   Received from [facility], [facility]   [employer] [facility]ational [facility] - Occupational Stress Questionnaire    Feeling of Stress : To some extent  Social Connections: Socially  Integrated (06/26/2022)   Received from [facility], [facility]   Social Network    How would you rate your social network (family, work, friends)?: Good participation with social networks  Intimate Partner Violence: Not At Risk (06/26/2022)   Received from [facility], [facility]   HITS    Over the last 12 months how often did your partner physically hurt you?: Never    Over the last 12 months how often did your partner insult you or talk down to you?: Never    Over the last 12 months how often did your partner threaten you with physical harm?: Never    Over the last 12 months how often did your partner scream or curse at you?: Never    Past Medical History, Surgical history, Social history, and Family history were reviewed and updated as appropriate.   Please see review of systems for further details on the patient's review from today.   Objective:   Physical Exam:  There were no vitals taken for this visit.  Physical Exam Neurological:     Mental Status: She is alert and oriented to person, place, and time.     Cranial Nerves: No dysarthria.  Psychiatric:        Attention and Perception: Attention and perception normal.        Mood and Affect: Mood normal.        Speech: Speech normal.        Behavior: Behavior is cooperative.        Thought Content: Thought content normal. Thought content is not paranoid or delusional. Thought content does not include homicidal or suicidal ideation. Thought content does not include homicidal or suicidal plan.        Cognition and Memory: Cognition and memory normal.        Judgment: Judgment normal.     Comments:  Insight intact     Lab Review:     Component Value Date/Time   NA 139 07/01/2023 1646   K 4.4 07/01/2023 1646   CL 103 07/01/2023 1646   CO2 23 07/01/2023 1646   GLUCOSE 85 07/01/2023 1646   GLUCOSE 85 03/29/2016 1135   BUN 15 07/01/2023 1646   CREATININE 0.94 07/01/2023 1646   CALCIUM 9.3 07/01/2023 1646   PROT  6.7 07/01/2023 1646   ALBUMIN 4.4 07/01/2023 1646   AST 24 07/01/2023 1646   ALT 29 07/01/2023 1646   ALKPHOS 104 07/01/2023 1646   BILITOT 0.3 07/01/2023 1646   GFRNONAA >60 03/29/2016 1135   GFRAA >60 03/29/2016 1135       Component Value Date/Time   WBC 6.2 07/01/2023 1646   WBC 6.8 03/29/2016 1135   RBC 4.21 07/01/2023 1646   RBC 3.84 (L) 03/29/2016 1135   HGB 12.6 07/01/2023 1646   HCT 39.1 07/01/2023 1646   PLT 235 07/01/2023 1646   MCV 93 07/01/2023 1646   MCH 29.9 07/01/2023 1646   MCH 31.0 03/29/2016 1135   MCHC 32.2 07/01/2023 1646   MCHC 34.0 03/29/2016 1135   RDW 13.4 07/01/2023 1646   LYMPHSABS 3.0 03/15/2014 0540   MONOABS 0.6 03/15/2014 0540   EOSABS 0.2 03/15/2014 0540   BASOSABS 0.0 03/15/2014 0540    No results found for: "POCLITH", "LITHIUM"   No results found for: "PHENYTOIN", "PHENOBARB", "VALPROATE", "CBMZ"   .res Assessment: Plan:    35 minutes spent dedicated to the care of this patient on the date of this encounter to include pre-visit review of records, ordering of medication, post visit documentation, and face-to-face time with the patient discussing symptoms and treatment options. She reports that Naltrexone was prescribed to help reduce ETOH use and that she has not taken it consistently. Encouraged pt to consider trying to take Naltrexone consistently since it may reduce cravings to drink, which may also improve BP. Will continue Sertraline 200 mg daily for depression and anxiety.  Continue Wellbutrin XL 150 mg daily for depression.  Continue Seroquel 50 mg 1-2 tabs at bedtime for depression and insomnia.  Continue Lunesta 3 mg po at bedtime for insomnia.  Pt to follow-up in 6 months or sooner if clinically indicated.  Patient advised to contact office with any questions, adverse effects, or acute worsening in signs and symptoms.   Joan "Eunice Blase" was seen today for follow-up.  Diagnoses and all orders for this visit:  Depression,  unspecified depression type -     buPROPion (WELLBUTRIN XL) 150 MG 24 hr tablet; Take 1 tablet (150 mg total) by mouth daily. -     QUEtiapine (SEROQUEL) 50 MG tablet; Take 1-2 tablets (50-100 mg total) by mouth at bedtime. -     sertraline (ZOLOFT) 100 MG tablet; Take 2 tablets (200 mg total) by mouth daily.  Insomnia, unspecified type -     Eszopiclone 3 MG TABS; Take 1 tablet (3 mg total) by mouth at bedtime. Take immediately before bedtime -     QUEtiapine (SEROQUEL) 50 MG tablet; Take 1-2 tablets (50-100 mg total) by mouth at bedtime.  Generalized anxiety disorder -     sertraline (ZOLOFT) 100 MG tablet; Take 2 tablets (200 mg total) by mouth daily.     Please see After Visit Summary for patient specific instructions.  Future Appointments  Date Time Provider Department Center  04/07/2024 11:00 AM Sater, Pearletha Furl, MD [facility] None  07/30/2024  2:30 PM Sater,  Pearletha Furl, MD [facility] None    No orders of the defined types were placed in this encounter.     -------------------------------

## 2023-10-23 ENCOUNTER — Telehealth: Payer: Self-pay | Admitting: Psychiatry

## 2023-10-23 ENCOUNTER — Other Ambulatory Visit: Payer: Self-pay

## 2023-10-23 DIAGNOSIS — G4700 Insomnia, unspecified: Secondary | ICD-10-CM

## 2023-10-23 NOTE — Telephone Encounter (Signed)
 Pt called at 4:59p.  She is a former pt of Jessica's.  She was trying to get an appt with her but she is out of Eszopiclone.  It looks like the script from Dec 2024 ends (it only had 2 refills) before the one for March 2025 begins.  Pls [facility] to [id number] [place], [place] - [address] [place] [address] Phone: [phone]  Fax: [phone]   She thinks she is going to continue to come to [facility].  I will give her info to beth to find a different provider to see here.

## 2023-10-23 NOTE — Telephone Encounter (Signed)
 Pended [facility] [facility].

## 2023-10-23 NOTE — Telephone Encounter (Signed)
Last filled 2/5

## 2023-10-24 MED ORDER — ESZOPICLONE 3 MG PO TABS: 3.0000 mg | ORAL_TABLET | Freq: Every day | ORAL | 0 refills | Status: DC

## 2023-10-24 NOTE — Telephone Encounter (Signed)
 Needs appt her or with Shanda Bumps for RF

## 2023-10-25 NOTE — Telephone Encounter (Signed)
 Pt confirmed that she does want to see another provider at [facility].  I gave Beth her info on 3/6.

## 2023-11-01 NOTE — Telephone Encounter (Signed)
 Pt will continue with [facility]. Beth working on provider for apt.

## 2023-11-20 ENCOUNTER — Other Ambulatory Visit: Payer: Self-pay | Admitting: Neurology

## 2023-11-20 NOTE — Telephone Encounter (Signed)
 Last seen 07/25/23 Follow up scheduled on 04/07/24

## 2023-11-22 ENCOUNTER — Other Ambulatory Visit: Payer: Self-pay | Admitting: Psychiatry

## 2023-11-22 DIAGNOSIS — G4700 Insomnia, unspecified: Secondary | ICD-10-CM

## 2023-12-22 ENCOUNTER — Other Ambulatory Visit: Payer: Self-pay | Admitting: Cardiology

## 2024-01-10 ENCOUNTER — Ambulatory Visit: Admitting: Physician Assistant

## 2024-01-20 ENCOUNTER — Other Ambulatory Visit: Payer: Self-pay | Admitting: Physician Assistant

## 2024-01-20 DIAGNOSIS — G4700 Insomnia, unspecified: Secondary | ICD-10-CM

## 2024-02-10 ENCOUNTER — Telehealth: Payer: Self-pay | Admitting: Physician Assistant

## 2024-02-10 DIAGNOSIS — F32A Depression, unspecified: Secondary | ICD-10-CM

## 2024-02-10 NOTE — Telephone Encounter (Signed)
 Next appt is 02/25/24. Joan West has an appt today but patient states it was for today about 3 pm. She was not on schedule. R/S her for 02/25/24 to see TH. She previously saw [place]. Joan West is requesting refills on the following.   Eszopliclone 3 mg, Sertraline  100 mg, 2/day and Bupropion  150 mg. Pharmacy is:  [id number] [place], [place] - [address]   Phone: [phone]  Fax: [phone]

## 2024-02-11 MED ORDER — BUPROPION HCL ER (XL) 150 MG PO TB24
150.0000 mg | ORAL_TABLET | Freq: Every day | ORAL | 0 refills | Status: DC
Start: 2024-02-11 — End: 2024-02-25

## 2024-02-11 NOTE — Telephone Encounter (Signed)
 Pt has RF for Lunesta . Sent 30-day supply [facility]ropion  [facility] [address] [facility].

## 2024-02-18 ENCOUNTER — Other Ambulatory Visit: Payer: Self-pay | Admitting: Cardiology

## 2024-02-25 ENCOUNTER — Encounter: Payer: Self-pay | Admitting: Physician Assistant

## 2024-02-25 ENCOUNTER — Ambulatory Visit (INDEPENDENT_AMBULATORY_CARE_PROVIDER_SITE_OTHER): Admitting: Physician Assistant

## 2024-02-25 DIAGNOSIS — F32A Depression, unspecified: Secondary | ICD-10-CM

## 2024-02-25 DIAGNOSIS — G4700 Insomnia, unspecified: Secondary | ICD-10-CM | POA: Diagnosis not present

## 2024-02-25 DIAGNOSIS — F1090 Alcohol use, unspecified, uncomplicated: Secondary | ICD-10-CM

## 2024-02-25 DIAGNOSIS — F411 Generalized anxiety disorder: Secondary | ICD-10-CM

## 2024-02-25 MED ORDER — ZOLPIDEM TARTRATE 10 MG PO TABS: 10.0000 mg | ORAL_TABLET | Freq: Every evening | ORAL | 1 refills | Status: DC | PRN

## 2024-02-25 MED ORDER — BUPROPION HCL ER (XL) 150 MG PO TB24: 150.0000 mg | ORAL_TABLET | Freq: Every day | ORAL | 3 refills | Status: AC

## 2024-02-25 MED ORDER — ACAMPROSATE CALCIUM 333 MG PO TBEC: 666.0000 mg | DELAYED_RELEASE_TABLET | Freq: Three times a day (TID) | ORAL | 1 refills | Status: DC

## 2024-02-25 NOTE — Progress Notes (Signed)
 [facility]  Patient ID: Joan West,  MRN: [id number]  PCP: Boneta, [place]  E, PA-C  Date of Evaluation: 02/25/2024 Time spent:25 minutes  Chief Complaint:  Chief Complaint   Anxiety; Depression; Insomnia; Follow-up     HISTORY/CURRENT STATUS: HPI transferred to my care from Harlene Pepper, NP, who is no longer with her practice.  Insomnia is an ongoing battle. Takes Seroquel , Gabapentin , Lunesta , 1-2 ETOH to help sleep. Never wants ETOH during the day, only to help sleep. Has been on this regimen for years, and without it, can't sleep. There was a time she didn't sleep for 5 nights when she didn't take the med. She doesn't want to drink alcohol and concerned it may get worse, asks for help to prevent that.   Patient is able to enjoy things.  Energy and motivation are good.  Work is going well.   No extreme sadness, tearfulness, or feelings of hopelessness.  ADLs and personal hygiene are normal.   Denies any changes in concentration, making decisions, or remembering things.  Appetite has not changed.  Weight is stable.  No PA.  No mania, psychosis, delirium, SI/HI.    Denies dizziness, syncope, seizures, numbness, tingling, tremor, tics, unsteady gait, slurred speech, confusion. Denies muscle or joint pain, stiffness, or dystonia.  Individual Medical History/ Review of Systems: Changes? :No   Past medication trials: Cymbalta-helpful for depression but not anxiety Sertraline -effective for mood and anxiety. Thinks it may cause intrusive Citalopram Wellbutrin  Seroquel  Trazodone-ineffective Melatonin-ineffective Benadryl -ineffective for insomnia Ambien -parasomnias Belsomra - unsure of benefit.  Trileptal-ineffective for insomnia or anxiety Gabapentin - helps with RLS Lunesta   Topamax- Prescribed for weight loss and was ineffective   Allergies: Sulfa antibiotics  Current Medications:  Current Outpatient Medications:    acamprosate  (CAMPRAL ) 333 MG tablet,  Take 2 tablets (666 mg total) by mouth 3 (three) times daily with meals., Disp: 180 tablet, Rfl: 1   amLODipine  (NORVASC ) 5 MG tablet, TAKE 1 TABLET (5 MG TOTAL) BY MOUTH DAILY., Disp: 90 tablet, Rfl: 3   Cholecalciferol (VITAMIN D PO), Take 1 tablet by mouth daily., Disp: , Rfl:    gabapentin  (NEURONTIN ) 300 MG capsule, One po qAM, one po q Evening and two po qHS, Disp: 360 capsule, Rfl: 3   losartan  (COZAAR ) 100 MG tablet, TAKE 1 TABLET BY MOUTH EVERY DAY, Disp: 90 tablet, Rfl: 1   Multiple Vitamin (MULTIVITAMIN) tablet, Take 1 tablet by mouth daily. Centrum Silver daily, Disp: , Rfl:    QUEtiapine  (SEROQUEL ) 50 MG tablet, Take 1-2 tablets (50-100 mg total) by mouth at bedtime. (Patient taking differently: Take 150 mg by mouth at bedtime.), Disp: 180 tablet, Rfl: 1   rosuvastatin  (CRESTOR ) 20 MG tablet, TAKE 1 TABLET BY MOUTH EVERY DAY, Disp: 90 tablet, Rfl: 1   SEMAGLUTIDE, 2 MG/DOSE, [place], Inject 2 mg into the skin once a week., Disp: , Rfl:    sertraline  (ZOLOFT ) 100 MG tablet, Take 2 tablets (200 mg total) by mouth daily., Disp: 180 tablet, Rfl: 2   SUMAtriptan  (IMITREX ) 100 MG tablet, Take 1 tablet (100 mg total) by mouth once as needed for up to 1 dose for migraine. May repeat in 2 hours if headache persists or recurs., Disp: 10 tablet, Rfl: 2   UNABLE TO FIND, CPAP, Disp: , Rfl:    zolpidem  (AMBIEN ) 10 MG tablet, Take 1 tablet (10 mg total) by mouth at bedtime as needed for sleep., Disp: 30 tablet, Rfl: 1   buPROPion  (WELLBUTRIN  XL) 150 MG 24 hr tablet, Take  1 tablet (150 mg total) by mouth daily., Disp: 90 tablet, Rfl: 3   methylPREDNISolone  (MEDROL ) 4 MG tablet, Taper from 6 pills po for one day to 1 pill po the last day over 6 days (Patient not taking: Reported on 08/16/2023), Disp: 21 tablet, Rfl: 0   rizatriptan  (MAXALT -MLT) 10 MG disintegrating tablet, TAKE 1 TABLET BY MOUTH AS NEEDED., Disp: 10 tablet, Rfl: 3  Current Facility-Administered Medications:    0.9 %  sodium chloride   infusion, 500 mL, Intravenous, Once, Nandigam, Kavitha V, MD Medication Side Effects: none  Family Medical/ Social History: Changes? No  MENTAL HEALTH EXAM:  There were no vitals taken for this visit.There is no height or weight on file to calculate BMI.  General Appearance: Casual, Well Groomed, and Obese  Eye Contact:  Good  Speech:  Clear and Coherent and Normal Rate  Volume:  Normal  Mood:  Euthymic  Affect:  Congruent  Thought Process:  Goal Directed and Descriptions of Associations: Circumstantial  Orientation:  Full (Time, Place, and Person)  Thought Content: Logical   Suicidal Thoughts:  No  Homicidal Thoughts:  No  Memory:  WNL  Judgement:  Good  Insight:  Good  Psychomotor Activity:  Normal  Concentration:  Concentration: Good  Recall:  Good  Fund of Knowledge: Good  Language: Good  Assets:  Desire for Improvement Financial Resources/Insurance Housing Transportation Vocational/Educational  ADL's:  Intact  Cognition: WNL  Prognosis:  Good   DIAGNOSES:    ICD-10-CM   1. Depression, unspecified depression type  F32.A buPROPion  (WELLBUTRIN  XL) 150 MG 24 hr tablet    2. Insomnia, unspecified type  G47.00     3. Generalized anxiety disorder  F41.1     4. Alcohol use  F10.90      Receiving Psychotherapy: No   RECOMMENDATIONS:   PDMP reviewed.  Lunesta  filled 02/19/2024. I provided approximately 25 minutes of face to face time during this encounter, including time spent before and after the visit in records review, medical decision making, counseling pertinent to today's visit, and charting.   Discussed insomnia.  Sleep hygiene is already good.  Continue CPAP.  Discussed ETOH for sleep and the negative effects it has. Since she's been on the combination for years, I will cont to Rx controlled sleep med but if she's unable to quit nightly alcohol, will reconsider.   Discussed accamprosate.  Benefits, risks, SE disc and she would like to try it.   Start  Acamprosate  333 mg, 2 tid. Cont Wellbutrin  XL 150 mg q am. Continue Gabapentin  300 mg, 1 q am, 2 at bedtime. Continue Seroquel  50 mg, 1-2 at bedtime for sleep. Cont Zoloft  100 mg, 2 every day. Cont Ambien  10 mg, 1 at bedtime prn.  Return in 6-8 weeks.  Verneita Cooks, PA-C

## 2024-02-27 ENCOUNTER — Other Ambulatory Visit: Payer: Self-pay

## 2024-02-28 ENCOUNTER — Other Ambulatory Visit: Payer: Self-pay

## 2024-03-11 ENCOUNTER — Other Ambulatory Visit: Payer: Self-pay

## 2024-03-11 DIAGNOSIS — F32A Depression, unspecified: Secondary | ICD-10-CM

## 2024-03-11 DIAGNOSIS — G4700 Insomnia, unspecified: Secondary | ICD-10-CM

## 2024-03-11 MED ORDER — QUETIAPINE FUMARATE 50 MG PO TABS: 50.0000 mg | ORAL_TABLET | Freq: Every day | ORAL | 0 refills | Status: DC

## 2024-03-18 ENCOUNTER — Other Ambulatory Visit: Payer: Self-pay | Admitting: Cardiology

## 2024-03-18 ENCOUNTER — Other Ambulatory Visit: Payer: Self-pay | Admitting: Physician Assistant

## 2024-04-01 ENCOUNTER — Encounter: Payer: Self-pay | Admitting: Cardiology

## 2024-04-01 ENCOUNTER — Telehealth: Payer: Self-pay | Admitting: Neurology

## 2024-04-01 ENCOUNTER — Telehealth: Payer: Self-pay | Admitting: Cardiology

## 2024-04-01 DIAGNOSIS — E785 Hyperlipidemia, unspecified: Secondary | ICD-10-CM

## 2024-04-01 NOTE — Telephone Encounter (Signed)
 Patient is requesting order(s) for lab work prior to 11/26 appointment with Dr. Lavona.

## 2024-04-01 NOTE — Telephone Encounter (Signed)
 Request to cx, no longer needed

## 2024-04-02 NOTE — Telephone Encounter (Signed)
 Attempted to reach patient. Left message to call back on personal voicemail.

## 2024-04-06 NOTE — Telephone Encounter (Signed)
 Left message for patient of recommendations. Lab orders mailed to the pt

## 2024-04-07 ENCOUNTER — Ambulatory Visit: Payer: Managed Care, Other (non HMO) | Admitting: Neurology

## 2024-04-09 ENCOUNTER — Ambulatory Visit: Admitting: Physician Assistant

## 2024-04-17 ENCOUNTER — Other Ambulatory Visit: Payer: Self-pay | Admitting: Physician Assistant

## 2024-04-26 ENCOUNTER — Other Ambulatory Visit: Payer: Self-pay | Admitting: Physician Assistant

## 2024-04-26 DIAGNOSIS — G4700 Insomnia, unspecified: Secondary | ICD-10-CM

## 2024-05-08 ENCOUNTER — Ambulatory Visit (INDEPENDENT_AMBULATORY_CARE_PROVIDER_SITE_OTHER): Admitting: Physician Assistant

## 2024-05-08 ENCOUNTER — Encounter: Payer: Self-pay | Admitting: Physician Assistant

## 2024-05-08 DIAGNOSIS — F411 Generalized anxiety disorder: Secondary | ICD-10-CM | POA: Diagnosis not present

## 2024-05-08 DIAGNOSIS — G4733 Obstructive sleep apnea (adult) (pediatric): Secondary | ICD-10-CM | POA: Diagnosis not present

## 2024-05-08 DIAGNOSIS — F1090 Alcohol use, unspecified, uncomplicated: Secondary | ICD-10-CM | POA: Diagnosis not present

## 2024-05-08 DIAGNOSIS — F32A Depression, unspecified: Secondary | ICD-10-CM

## 2024-05-08 DIAGNOSIS — G4700 Insomnia, unspecified: Secondary | ICD-10-CM

## 2024-05-08 MED ORDER — QUETIAPINE FUMARATE 50 MG PO TABS: 50.0000 mg | ORAL_TABLET | Freq: Every day | ORAL | 3 refills | Status: AC

## 2024-05-08 MED ORDER — ZOLPIDEM TARTRATE 10 MG PO TABS: 10.0000 mg | ORAL_TABLET | Freq: Every evening | ORAL | 5 refills | Status: AC | PRN

## 2024-05-08 NOTE — Progress Notes (Signed)
 [facility]  Patient ID: Joan West,  MRN: [id number]  PCP: West, Joan  E, PA-C  Date of Evaluation: 05/08/2024 Time spent:25 minutes  Chief Complaint:  Chief Complaint   Follow-up    HISTORY/CURRENT STATUS: HPI  for routine med check.   Sleeping through the night better with Ambien  and Seroquel .  Also uses CPAP as directed and feels more rested when she gets up.  Mood is stable.  She is able to enjoy things.  Energy and motivation are good.   No extreme sadness, tearfulness, or feelings of hopelessness. ADLs and personal hygiene are normal.   Denies any changes in concentration, making decisions, or remembering things.  Appetite has not changed.  Weight is stable. Not drinking nearly as much as she did.  Campral  helps a lot.  No mania, delirium, AH/VH.  No SI/HI.  Individual Medical History/ Review of Systems: Changes? :No   Past medication trials: Cymbalta-helpful for depression but not anxiety Sertraline -effective for mood and anxiety. Thinks it may cause intrusive Citalopram Wellbutrin  Seroquel  Trazodone-ineffective Melatonin-ineffective Benadryl -ineffective for insomnia Ambien -parasomnias Belsomra - unsure of benefit.  Trileptal-ineffective for insomnia or anxiety Gabapentin - helps with RLS Lunesta   Topamax- Prescribed for weight loss and was ineffective   Allergies: Sulfa antibiotics  Current Medications:  Current Outpatient Medications:    acamprosate  (CAMPRAL ) 333 MG tablet, TAKE 2 TABLETS (666 MG TOTAL) BY MOUTH 3 (THREE) TIMES DAILY WITH MEALS., Disp: 540 tablet, Rfl: 0   amLODipine  (NORVASC ) 5 MG tablet, TAKE 1 TABLET (5 MG TOTAL) BY MOUTH DAILY., Disp: 90 tablet, Rfl: 0   buPROPion  (WELLBUTRIN  XL) 150 MG 24 hr tablet, Take 1 tablet (150 mg total) by mouth daily., Disp: 90 tablet, Rfl: 3   Cholecalciferol (VITAMIN D PO), Take 1 tablet by mouth daily., Disp: , Rfl:    gabapentin  (NEURONTIN ) 300 MG capsule, One po qAM, one po q Evening  and two po qHS, Disp: 360 capsule, Rfl: 3   losartan  (COZAAR ) 100 MG tablet, TAKE 1 TABLET BY MOUTH EVERY DAY, Disp: 90 tablet, Rfl: 1   Multiple Vitamin (MULTIVITAMIN) tablet, Take 1 tablet by mouth daily. Centrum Silver daily, Disp: , Rfl:    rizatriptan  (MAXALT -MLT) 10 MG disintegrating tablet, TAKE 1 TABLET BY MOUTH AS NEEDED., Disp: 10 tablet, Rfl: 3   rosuvastatin  (CRESTOR ) 20 MG tablet, TAKE 1 TABLET BY MOUTH EVERY DAY, Disp: 90 tablet, Rfl: 1   SEMAGLUTIDE, 2 MG/DOSE, [place], Inject 2 mg into the skin once a week., Disp: , Rfl:    sertraline  (ZOLOFT ) 100 MG tablet, Take 2 tablets (200 mg total) by mouth daily., Disp: 180 tablet, Rfl: 2   UNABLE TO FIND, CPAP, Disp: , Rfl:    methylPREDNISolone  (MEDROL ) 4 MG tablet, Taper from 6 pills po for one day to 1 pill po the last day over 6 days (Patient not taking: Reported on 05/08/2024), Disp: 21 tablet, Rfl: 0   QUEtiapine  (SEROQUEL ) 50 MG tablet, Take 1-2 tablets (50-100 mg total) by mouth at bedtime., Disp: 180 tablet, Rfl: 3   SUMAtriptan  (IMITREX ) 100 MG tablet, Take 1 tablet (100 mg total) by mouth once as needed for up to 1 dose for migraine. May repeat in 2 hours if headache persists or recurs. (Patient not taking: Reported on 05/08/2024), Disp: 10 tablet, Rfl: 2   zolpidem  (AMBIEN ) 10 MG tablet, Take 1 tablet (10 mg total) by mouth at bedtime as needed. for sleep, Disp: 30 tablet, Rfl: 5  Current Facility-Administered Medications:    0.9 %  sodium chloride  infusion, 500 mL, Intravenous, Once, Nandigam, Kavitha V, MD Medication Side Effects: none  Family Medical/ Social History: Changes? No  MENTAL HEALTH EXAM:  There were no vitals taken for this visit.There is no height or weight on file to calculate BMI.  General Appearance: Casual, Well Groomed, and Obese  Eye Contact:  Good  Speech:  Clear and Coherent and Normal Rate  Volume:  Normal  Mood:  Euthymic  Affect:  Congruent  Thought Process:  Goal Directed and Descriptions of  Associations: Circumstantial  Orientation:  Full (Time, Place, and Person)  Thought Content: Logical   Suicidal Thoughts:  No  Homicidal Thoughts:  No  Memory:  WNL  Judgement:  Good  Insight:  Good  Psychomotor Activity:  Normal  Concentration:  Concentration: Good  Recall:  Good  Fund of Knowledge: Good  Language: Good  Assets:  Communication Skills Desire for Improvement Financial Resources/Insurance Housing Transportation Vocational/Educational  ADL's:  Intact  Cognition: WNL  Prognosis:  Good   DIAGNOSES:    ICD-10-CM   1. Depression, unspecified depression type  F32.A QUEtiapine  (SEROQUEL ) 50 MG tablet    2. Obstructive sleep apnea  G47.33     3. Generalized anxiety disorder  F41.1     4. Alcohol use  F10.90     5. Insomnia, unspecified type  G47.00 QUEtiapine  (SEROQUEL ) 50 MG tablet    zolpidem  (AMBIEN ) 10 MG tablet     Receiving Psychotherapy: No   RECOMMENDATIONS:   PDMP reviewed.  Ambien  filled 04/28/2024. I provided approximately  20  minutes of face to face time during this encounter, including time spent before and after the visit in records review, medical decision making, counseling pertinent to today's visit, and charting.   I'm glad to see her doing better!  Cont Campral  333 mg, 2 po tid. Cont Wellbutrin  XL 150 mg q am. Continue Gabapentin  300 mg, 1 q am, 2 at bedtime. Continue Seroquel  50 mg, 1-2 at bedtime for sleep. Cont Zoloft  100 mg, 2 every day. Cont Ambien  10 mg, 1 at bedtime prn.  Return in 6 months.  Verneita Cooks, PA-C

## 2024-06-20 ENCOUNTER — Other Ambulatory Visit: Payer: Self-pay | Admitting: Cardiology

## 2024-06-27 ENCOUNTER — Other Ambulatory Visit: Payer: Self-pay | Admitting: Cardiology

## 2024-07-08 ENCOUNTER — Other Ambulatory Visit: Payer: Self-pay | Admitting: Physician Assistant

## 2024-07-15 ENCOUNTER — Ambulatory Visit: Admitting: Cardiology

## 2024-07-30 ENCOUNTER — Ambulatory Visit: Payer: Managed Care, Other (non HMO) | Admitting: Neurology

## 2024-08-19 LAB — LIPID PANEL
Chol/HDL Ratio: 2 ratio (ref 0.0–4.4)
Cholesterol, Total: 175 mg/dL (ref 100–199)
HDL: 88 mg/dL
LDL Chol Calc (NIH): 68 mg/dL (ref 0–99)
Triglycerides: 111 mg/dL (ref 0–149)
VLDL Cholesterol Cal: 19 mg/dL (ref 5–40)

## 2024-08-20 ENCOUNTER — Ambulatory Visit: Payer: Self-pay | Admitting: Cardiology

## 2024-08-20 ENCOUNTER — Other Ambulatory Visit: Payer: Self-pay | Admitting: Cardiology

## 2024-08-21 ENCOUNTER — Other Ambulatory Visit: Payer: Self-pay

## 2024-08-21 DIAGNOSIS — F411 Generalized anxiety disorder: Secondary | ICD-10-CM

## 2024-08-21 DIAGNOSIS — F32A Depression, unspecified: Secondary | ICD-10-CM

## 2024-08-21 MED ORDER — SERTRALINE HCL 100 MG PO TABS: 200.0000 mg | ORAL_TABLET | Freq: Every day | ORAL | 0 refills | Status: AC

## 2024-08-26 NOTE — Progress Notes (Signed)
" °  Cardiology Office Note:   Date:  08/27/2024  ID:  Joan West Dollar, DOB 1966/05/05, MRN [id number] PCP: Boneta Marty BRAVO, PA-C  [facility] Providers Cardiologist:  Lynwood Schilling, MD {  History of Present Illness:   Joan West is a 59 y.o. female who presents for evaluation of CAD and HTN.  She has a very strong family history of obstructive coronary disease with her father dying at age 42 with coronary disease and her brother already has an LVAD.  She was seeing another cardiology group in the region.  She actually had a stress echocardiogram which I think was equivocal.  I do see a cardiac catheterization in July 2020 that demonstrated minor luminal irregularities in multiple vessels with the most focal lesion being a 20 to 30% mid circumflex stenosis.   Since I last saw her she has had no new cardiovascular problems.  The patient denies any new symptoms such as chest discomfort, neck or arm discomfort. There has been no new shortness of breath, PND or orthopnea. There have been no reported palpitations, presyncope or syncope.  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  ROS: As stated in the HPI and negative for all other systems.  Studies Reviewed:    EKG:   EKG Interpretation Date/Time:  Thursday August 27 2024 11:40:02 EST Ventricular Rate:  74 PR Interval:  172 QRS Duration:  86 QT Interval:  386 QTC Calculation: 428 R Axis:   -6  Text Interpretation: Normal sinus rhythm Minimal voltage criteria for LVH, may be normal variant ( R in aVL ) When compared with ECG of 12-Jul-2023 16:32, No significant change was found Confirmed by Schilling Rattan ([id number]) on 08/27/2024 12:03:44 PM    Risk Assessment/Calculations:              Physical Exam:   VS:  BP 133/86 (BP Location: Left Arm, Patient Position: Sitting)   Pulse 74   Ht 5' 3 (1.6 m)   Wt 229 lb (103.9 kg)   SpO2 97%   BMI 40.57  kg/m    Wt Readings from Last 3 Encounters:  08/27/24 229 lb (103.9 kg)  07/25/23 240 lb 8 oz (109.1 kg)  07/12/23 242 lb (109.8 kg)     GEN: Well nourished, well developed in no acute distress NECK: No JVD; No carotid bruits CARDIAC: RRR, no murmurs, rubs, gallops RESPIRATORY:  Clear to auscultation without rales, wheezing or rhonchi  ABDOMEN: Soft, non-tender, non-distended EXTREMITIES:  No edema; No deformity   ASSESSMENT AND PLAN:   Non obstructive CAD: She has no new symptoms.  We discussed again risk reduction.  I have asked her to get a high-sensitivity C-reactive protein if she has blood work done by her primary care physician.  For now she will continue the meds as listed.  HTN: I given her goal blood pressure 120s over 70s and she will check this at home and if she is not at target I will make further adjustments.  She did have amlodipine  added.  Dyslipidemia:   Goal LDL should be probably in the 70s with an HDL above 50.  She is currently at that target.  Morbid obesity: I discussed weight loss plans   to include diet and exercise.     Follow up with me in 1 year.  Signed, Lynwood Schilling, MD   "

## 2024-08-27 ENCOUNTER — Ambulatory Visit: Attending: Cardiology | Admitting: Cardiology

## 2024-08-27 ENCOUNTER — Encounter: Payer: Self-pay | Admitting: Cardiology

## 2024-08-27 VITALS — BP 133/86 | HR 74 | Ht 63.0 in | Wt 229.0 lb

## 2024-08-27 DIAGNOSIS — I10 Essential (primary) hypertension: Secondary | ICD-10-CM | POA: Diagnosis not present

## 2024-08-27 DIAGNOSIS — E785 Hyperlipidemia, unspecified: Secondary | ICD-10-CM

## 2024-08-27 NOTE — Patient Instructions (Addendum)
 Medication Instructions:  Your physician recommends that you continue on your current medications as directed. Please refer to the Current Medication list given to you today. *If you need a refill on your cardiac medications before your next appointment, please call your pharmacy*  Lab Work: High sensitivty CRP at your Primary Care Provider's office If you have labs (blood work) drawn today and your tests are completely normal, you will receive your results only by: MyChart Message (if you have MyChart) OR A paper copy in the mail If you have any lab test that is abnormal or we need to change your treatment, we will call you to review the results.  Testing/Procedures: NONE  Follow-Up: At [facility], you and your health needs are our priority.  As part of our continuing mission to provide you with exceptional heart care, our providers are all part of one team.  This team includes your primary Cardiologist (physician) and Advanced Practice Providers or APPs (Physician Assistants and Nurse Practitioners) who all work together to provide you with the care you need, when you need it.  Your next appointment:   1 year(s)  Provider:   Lynwood Schilling, MD    We recommend signing up for the patient portal called MyChart.  Sign up information is provided on this After Visit Summary.  MyChart is used to connect with patients for Virtual Visits (Telemedicine).  Patients are able to view lab/test results, encounter notes, upcoming appointments, etc.  Non-urgent messages can be sent to your provider as well.   To learn more about what you can do with MyChart, go to [email or web address].

## 2024-09-18 ENCOUNTER — Other Ambulatory Visit: Payer: Self-pay | Admitting: Cardiology

## 2024-11-16 ENCOUNTER — Ambulatory Visit: Admitting: Physician Assistant

## 2025-03-11 ENCOUNTER — Ambulatory Visit: Payer: Self-pay | Admitting: Neurology
